# Patient Record
Sex: Female | Born: 1945 | Race: White | Hispanic: No | Smoking: Former smoker
Health system: Southern US, Community
[De-identification: ages and names within clinical notes are randomized; demographics above are authoritative.]

## PROBLEM LIST (undated history)

## (undated) DIAGNOSIS — C801 Malignant (primary) neoplasm, unspecified: Secondary | ICD-10-CM

## (undated) DIAGNOSIS — T7840XA Allergy, unspecified, initial encounter: Secondary | ICD-10-CM

## (undated) DIAGNOSIS — E079 Disorder of thyroid, unspecified: Secondary | ICD-10-CM

## (undated) DIAGNOSIS — J45909 Unspecified asthma, uncomplicated: Secondary | ICD-10-CM

## (undated) DIAGNOSIS — D649 Anemia, unspecified: Secondary | ICD-10-CM

## (undated) DIAGNOSIS — F419 Anxiety disorder, unspecified: Secondary | ICD-10-CM

## (undated) HISTORY — PX: ABDOMINAL HYSTERECTOMY: SHX81

## (undated) HISTORY — DX: Malignant (primary) neoplasm, unspecified: C80.1

## (undated) HISTORY — DX: Anxiety disorder, unspecified: F41.9

## (undated) HISTORY — DX: Anemia, unspecified: D64.9

## (undated) HISTORY — DX: Allergy, unspecified, initial encounter: T78.40XA

## (undated) HISTORY — DX: Unspecified asthma, uncomplicated: J45.909

## (undated) HISTORY — DX: Disorder of thyroid, unspecified: E07.9

## (undated) HISTORY — PX: BREAST SURGERY: SHX581

---

## 2001-12-17 ENCOUNTER — Emergency Department (HOSPITAL_COMMUNITY): Admission: EM | Admit: 2001-12-17 | Discharge: 2001-12-17 | Payer: Self-pay | Admitting: Emergency Medicine

## 2003-02-27 ENCOUNTER — Encounter: Payer: Self-pay | Admitting: Internal Medicine

## 2003-02-27 ENCOUNTER — Encounter: Admission: RE | Admit: 2003-02-27 | Discharge: 2003-02-27 | Payer: Self-pay | Admitting: Internal Medicine

## 2004-05-27 ENCOUNTER — Ambulatory Visit (HOSPITAL_COMMUNITY): Admission: RE | Admit: 2004-05-27 | Discharge: 2004-05-27 | Payer: Self-pay | Admitting: *Deleted

## 2004-09-09 ENCOUNTER — Ambulatory Visit: Admission: RE | Admit: 2004-09-09 | Discharge: 2004-09-09 | Payer: Self-pay | Admitting: Emergency Medicine

## 2007-02-01 ENCOUNTER — Encounter: Admission: RE | Admit: 2007-02-01 | Discharge: 2007-02-01 | Payer: Self-pay | Admitting: General Surgery

## 2009-10-08 LAB — HM MAMMOGRAPHY: HM Mammogram: NORMAL

## 2011-01-21 LAB — TSH: TSH: 0.44 u[IU]/mL (ref <=5.90)

## 2012-01-06 ENCOUNTER — Other Ambulatory Visit: Payer: Self-pay | Admitting: Family Medicine

## 2012-01-06 NOTE — Telephone Encounter (Signed)
Pt must have OV for CPE prior to running out of meds.

## 2012-01-31 ENCOUNTER — Encounter: Payer: Self-pay | Admitting: Family Medicine

## 2012-01-31 DIAGNOSIS — F419 Anxiety disorder, unspecified: Secondary | ICD-10-CM | POA: Insufficient documentation

## 2012-01-31 DIAGNOSIS — E039 Hypothyroidism, unspecified: Secondary | ICD-10-CM | POA: Insufficient documentation

## 2012-02-02 ENCOUNTER — Encounter: Payer: Self-pay | Admitting: Family Medicine

## 2012-02-02 ENCOUNTER — Ambulatory Visit (INDEPENDENT_AMBULATORY_CARE_PROVIDER_SITE_OTHER): Payer: Medicare Other | Admitting: Family Medicine

## 2012-02-02 DIAGNOSIS — Z0000 Encounter for general adult medical examination without abnormal findings: Secondary | ICD-10-CM

## 2012-02-02 DIAGNOSIS — F419 Anxiety disorder, unspecified: Secondary | ICD-10-CM

## 2012-02-02 DIAGNOSIS — Z23 Encounter for immunization: Secondary | ICD-10-CM

## 2012-02-02 DIAGNOSIS — R5383 Other fatigue: Secondary | ICD-10-CM

## 2012-02-02 DIAGNOSIS — E039 Hypothyroidism, unspecified: Secondary | ICD-10-CM

## 2012-02-02 DIAGNOSIS — Z780 Asymptomatic menopausal state: Secondary | ICD-10-CM

## 2012-02-02 DIAGNOSIS — F411 Generalized anxiety disorder: Secondary | ICD-10-CM

## 2012-02-02 NOTE — Progress Notes (Signed)
  Subjective:    Patient ID: Cathy Melendez, female    DOB: 07-27-1946, 66 y.o.   MRN: [id number]  HPI Patient presents for CPE  1) Hypothyroid- states increased fatigue may be secondary to using levothyroxine rather than     synthroid. Requests refill of synthroid and denote "brand name medically necessary". Patient      interested In seeing Dr. Jeraldine Loots.  2) Weight concerns- since 2009 weight gain of 5 pounds  3) Postmenopausal- Patient has reduced her dose by 50%.  Aware of increased risk breast cancer or clotting disorders.      Last pap 2007; s/p TAH  4) Anxiety- takes Zoloft 25 mg 1/2 tablet QOD.  States on this dosage anxiety is controlled   5) (R) pelvic pain; TAH in the past; Pap 2007 normal.  Review of Systems     Objective:   Physical Exam  Constitutional: She appears well-developed and well-nourished.  HENT:  Head: Normocephalic and atraumatic.  Eyes: EOM are normal. Pupils are equal, round, and reactive to light.  Neck: Neck supple. No thyromegaly present.  Cardiovascular: Normal rate, regular rhythm and normal heart sounds.   Pulmonary/Chest: Effort normal and breath sounds normal.  Abdominal: Soft. Bowel sounds are normal. There is no hepatosplenomegaly. There is no tenderness.  Musculoskeletal: Normal range of motion.  Lymphadenopathy:    She has no cervical adenopathy.  Neurological: She is alert.  Skin: Skin is warm.  Psychiatric: She has a normal mood and affect.          Assessment & Plan:   1. Routine general medical examination at a health care facility  Tdap vaccine greater than or equal to 7yo IM  2. Hypothyroid  CBC with Differential, Lipid panel, TSH  3. Anxiety    4. Postmenopausal    5. Fatigue  Comprehensive metabolic panel   Patient plans to follow up with Dr. Jeraldine Loots at [facility] regarding her thyroid. She is also aware of the long term risks of ERT in include increase risk of breast cancer and thromboembolic events.

## 2012-02-03 LAB — CBC WITH DIFFERENTIAL/PLATELET
Basophils Absolute: 0.1 10*3/uL (ref 0.0–0.1)
Basophils Relative: 1 % (ref 0–1)
Hemoglobin: 14.1 g/dL (ref 12.0–15.0)
MCH: 31.8 pg (ref 26.0–34.0)
MCHC: 33.3 g/dL (ref 30.0–36.0)
MCV: 95.5 fL (ref 78.0–100.0)
Monocytes Absolute: 0.7 10*3/uL (ref 0.1–1.0)
Monocytes Relative: 12 % (ref 3–12)
Neutro Abs: 1.6 10*3/uL — ABNORMAL LOW (ref 1.7–7.7)
RBC: 4.44 MIL/uL (ref 3.87–5.11)
RDW: 12.7 % (ref 11.5–15.5)

## 2012-02-03 LAB — COMPREHENSIVE METABOLIC PANEL
AST: 19 U/L (ref 0–37)
Alkaline Phosphatase: 45 U/L (ref 39–117)
BUN: 16 mg/dL (ref 6–23)
Calcium: 9.1 mg/dL (ref 8.4–10.5)
Glucose, Bld: 82 mg/dL (ref 70–99)
Potassium: 4 mEq/L (ref 3.5–5.3)
Total Bilirubin: 1 mg/dL (ref 0.3–1.2)

## 2012-02-03 LAB — LIPID PANEL
LDL Cholesterol: 124 mg/dL — ABNORMAL HIGH (ref 0–99)
VLDL: 24 mg/dL (ref 0–40)

## 2012-02-04 DIAGNOSIS — Z780 Asymptomatic menopausal state: Secondary | ICD-10-CM | POA: Insufficient documentation

## 2012-02-08 ENCOUNTER — Encounter: Payer: Self-pay | Admitting: *Deleted

## 2012-02-08 ENCOUNTER — Encounter: Payer: Self-pay | Admitting: Family Medicine

## 2012-02-20 ENCOUNTER — Other Ambulatory Visit: Payer: Self-pay

## 2012-02-20 MED ORDER — SERTRALINE HCL 25 MG PO TABS: 25.0000 mg | ORAL_TABLET | ORAL | Status: DC

## 2012-03-01 ENCOUNTER — Other Ambulatory Visit: Payer: Self-pay | Admitting: Physician Assistant

## 2012-03-17 ENCOUNTER — Other Ambulatory Visit: Payer: Self-pay | Admitting: Internal Medicine

## 2012-04-20 ENCOUNTER — Ambulatory Visit (INDEPENDENT_AMBULATORY_CARE_PROVIDER_SITE_OTHER): Payer: Medicare Other | Admitting: Family Medicine

## 2012-04-20 DIAGNOSIS — H6980 Other specified disorders of Eustachian tube, unspecified ear: Secondary | ICD-10-CM

## 2012-04-20 DIAGNOSIS — J309 Allergic rhinitis, unspecified: Secondary | ICD-10-CM

## 2012-04-20 MED ORDER — FLUTICASONE PROPIONATE 50 MCG/ACT NA SUSP: 2.0000 | Freq: Every day | NASAL | Status: DC

## 2012-04-20 MED ORDER — PREDNISONE 10 MG PO TABS: ORAL_TABLET | ORAL | Status: DC

## 2012-04-20 NOTE — Progress Notes (Signed)
  Subjective:    Patient ID: Cathy Melendez, female    DOB: Oct 09, 1946, 66 y.o.   MRN: [id number]  HPI  Complains of (R) sided ST; nasal congestion and PND since 12/12 (R) sided neck tenderness  CT sinuses in the past demonstrated deviated septum  Allegra and advil helps symptoms  Review of Systems  Constitutional: Positive for fatigue. Negative for fever and chills.  HENT: Positive for congestion (primarily (R) nares), rhinorrhea and postnasal drip.   denies ear symptoms  SH/ marital stressors    Objective:   Physical Exam  Constitutional: She appears well-developed.  HENT:       Septum deviated to (L)  boggy nares  clear PND  Neck: Neck supple.  Cardiovascular: Normal rate, regular rhythm and normal heart sounds.   Pulmonary/Chest: Effort normal and breath sounds normal.  Lymphadenopathy:    Cervical adenopathy: shoddy anterior nodes.  Neurological: She is alert.  Skin: Skin is warm.          Assessment & Plan:  Allergic rhinitis ETD  Flonase NS UAD Prednisone 10 mg X 7d Continue Allegra

## 2012-05-10 ENCOUNTER — Telehealth: Payer: Self-pay

## 2012-05-10 DIAGNOSIS — H6980 Other specified disorders of Eustachian tube, unspecified ear: Secondary | ICD-10-CM

## 2012-05-10 DIAGNOSIS — J309 Allergic rhinitis, unspecified: Secondary | ICD-10-CM

## 2012-05-10 NOTE — Telephone Encounter (Signed)
Pt is requesting antibiotics or a referral to ENT.  She is going out of town and would like something before she goes. Okay to leave MOM.  Please call [phone]

## 2012-05-11 NOTE — Telephone Encounter (Signed)
LMOM to CB to give [place] some more details about her current Sxs.

## 2012-05-11 NOTE — Telephone Encounter (Signed)
Can we get some more information? If she is not better she probably needs to RTC. We are happy to refer to ENT but she will likely not get in before she goes out of town.

## 2012-05-12 NOTE — Telephone Encounter (Signed)
LMOM to CB. We have started referral to ENT

## 2012-05-14 NOTE — Telephone Encounter (Signed)
LMOM for pt to CB if she is still not feeling well. Sent unable to reach letter w/info that referral is in process.

## 2012-08-04 ENCOUNTER — Encounter: Payer: Self-pay | Admitting: Physician Assistant

## 2012-08-04 DIAGNOSIS — H6980 Other specified disorders of Eustachian tube, unspecified ear: Secondary | ICD-10-CM

## 2012-08-04 DIAGNOSIS — H6990 Unspecified Eustachian tube disorder, unspecified ear: Secondary | ICD-10-CM | POA: Insufficient documentation

## 2012-08-04 DIAGNOSIS — J342 Deviated nasal septum: Secondary | ICD-10-CM

## 2012-09-01 ENCOUNTER — Encounter: Payer: Self-pay | Admitting: Family Medicine

## 2012-09-01 DIAGNOSIS — J310 Chronic rhinitis: Secondary | ICD-10-CM | POA: Insufficient documentation

## 2012-09-03 ENCOUNTER — Other Ambulatory Visit: Payer: Self-pay | Admitting: Physician Assistant

## 2012-09-17 ENCOUNTER — Other Ambulatory Visit: Payer: Self-pay | Admitting: Physician Assistant

## 2012-10-28 ENCOUNTER — Other Ambulatory Visit: Payer: Self-pay | Admitting: Physician Assistant

## 2013-01-19 ENCOUNTER — Encounter: Payer: Self-pay | Admitting: Family Medicine

## 2013-01-19 ENCOUNTER — Ambulatory Visit (INDEPENDENT_AMBULATORY_CARE_PROVIDER_SITE_OTHER): Payer: Medicare PPO | Admitting: Family Medicine

## 2013-01-19 VITALS — BP 114/65 | HR 68 | Temp 97.1°F | Resp 16 | Ht 66.5 in | Wt 129.0 lb

## 2013-01-19 DIAGNOSIS — D72820 Lymphocytosis (symptomatic): Secondary | ICD-10-CM

## 2013-01-19 DIAGNOSIS — Z86010 Personal history of colonic polyps: Secondary | ICD-10-CM

## 2013-01-19 DIAGNOSIS — Z760 Encounter for issue of repeat prescription: Secondary | ICD-10-CM

## 2013-01-19 DIAGNOSIS — E039 Hypothyroidism, unspecified: Secondary | ICD-10-CM

## 2013-01-19 DIAGNOSIS — C4490 Unspecified malignant neoplasm of skin, unspecified: Secondary | ICD-10-CM

## 2013-01-19 LAB — CBC WITH DIFFERENTIAL/PLATELET
Basophils Relative: 2 % — ABNORMAL HIGH (ref 0–1)
Eosinophils Absolute: 0.1 10*3/uL (ref 0.0–0.7)
Eosinophils Relative: 2 % (ref 0–5)
HCT: 39.9 % (ref 36.0–46.0)
Hemoglobin: 13.6 g/dL (ref 12.0–15.0)
Lymphs Abs: 2.3 10*3/uL (ref 0.7–4.0)
MCHC: 34.1 g/dL (ref 30.0–36.0)
Monocytes Absolute: 0.6 10*3/uL (ref 0.1–1.0)
Neutrophils Relative %: 29 % — ABNORMAL LOW (ref 43–77)
Smear Review: 0

## 2013-01-19 MED ORDER — LEVOTHYROXINE SODIUM 88 MCG PO TABS: ORAL_TABLET | ORAL | Status: DC

## 2013-01-19 MED ORDER — ESTROGENS CONJUGATED 0.3 MG PO TABS: ORAL_TABLET | ORAL | Status: DC

## 2013-01-19 NOTE — Patient Instructions (Addendum)
Shingles Vaccine What You Need to Know WHAT IS SHINGLES?  Shingles is a painful skin rash, often with blisters. It is also called Herpes Zoster or just Zoster.  A shingles rash usually appears on one side of the face or body and lasts from 2 to 4 weeks. Its main symptom is pain, which can be quite severe. Other symptoms of shingles can include fever, headache, chills, and upset stomach. Very rarely, a shingles infection can lead to pneumonia, hearing problems, blindness, brain inflammation (encephalitis), or death.  For about 1 person in 5, severe pain can continue even after the rash clears up. This is called post-herpetic neuralgia.  Shingles is caused by the Varicella Zoster virus. This is the same virus that causes chickenpox. Only someone who has had a case of chickenpox or rarely, has gotten chickenpox vaccine, can get shingles. The virus stays in your body. It can reappear many years later to cause a case of shingles.  You cannot catch shingles from another person with shingles. However, a person who has never had chickenpox (or chickenpox vaccine) could get chickenpox from someone with shingles. This is not very common.  Shingles is far more common in people 50 and older than in younger people. It is also more common in people whose immune systems are weakened because of a disease such as cancer or drugs such as steroids or chemotherapy.  At least 1 million people get shingles per year in the [place]. SHINGLES VACCINE  A vaccine for shingles was licensed in 2006. In clinical trials, the vaccine reduced the risk of shingles by 50%. It can also reduce the pain in people who still get shingles after being vaccinated.  A single dose of shingles vaccine is recommended for adults 60 years of age and older. SOME PEOPLE SHOULD NOT GET SHINGLES VACCINE OR SHOULD WAIT A person should not get shingles vaccine if he or she:  Has ever had a life-threatening allergic reaction to gelatin, the  antibiotic neomycin, or any other component of shingles vaccine. Tell your caregiver if you have any severe allergies.  Has a weakened immune system because of current:  AIDS or another disease that affects the immune system.  Treatment with drugs that affect the immune system, such as prolonged use of high-dose steroids.  Cancer treatment, such as radiation or chemotherapy.  Cancer affecting the bone marrow or lymphatic system, such as leukemia or lymphoma.  Is pregnant, or might be pregnant. Women should not become pregnant until at least 4 weeks after getting shingles vaccine. Someone with a minor illness, such as a cold, may be vaccinated. Anyone with a moderate or severe acute illness should usually wait until he or she recovers before getting the vaccine. This includes anyone with a temperature of 101.3 F (38 C) or higher. WHAT ARE THE RISKS FROM SHINGLES VACCINE?  A vaccine, like any medicine, could possibly cause serious problems, such as severe allergic reactions. However, the risk of a vaccine causing serious harm, or death, is extremely small.  No serious problems have been identified with shingles vaccine. Mild Problems  Redness, soreness, swelling, or itching at the site of the injection (about 1 person in 3).  Headache (about 1 person in 70). Like all vaccines, shingles vaccine is being closely monitored for unusual or severe problems. WHAT IF THERE IS A MODERATE OR SEVERE REACTION? What should I look for? Any unusual condition, such as a severe allergic reaction or a high fever. If a severe allergic reaction   occurred, it would be within a few minutes to an hour after the shot. Signs of a serious allergic reaction can include difficulty breathing, weakness, hoarseness or wheezing, a fast heartbeat, hives, dizziness, paleness, or swelling of the throat. What should I do?  Call your caregiver, or get the person to a caregiver right away.  Tell the caregiver what  happened, the date and time it happened, and when the vaccination was given.  Ask the caregiver to report the reaction by filing a Vaccine Adverse Event Reporting System (VAERS) form. Or, you can file this report through the VAERS web site at www.vaers.hhs.gov or by calling 1-800-822-7967. VAERS does not provide medical advice. HOW CAN I LEARN MORE?  Ask your caregiver. He or she can give you the vaccine package insert or suggest other sources of information.  Contact the Centers for Disease Control and Prevention (CDC):  Call 1-800-232-4636 (1-800-CDC-INFO).  Visit the CDC website at www.cdc.gov/vaccines CDC Shingles Vaccine VIS (09/12/08) Document Released: 09/21/2006 Document Revised: 02/16/2012 Document Reviewed: 09/12/2008 ExitCare Patient Information 2013 [employer].  

## 2013-01-19 NOTE — Progress Notes (Signed)
S:  This 67 y.o. Cauc female has a long hx of hypothyroidism, medication dating back to 19. Work -up has included evaluation by Dr. Nonie Hoyer at [facility] (thyroid ultrasound and FNA). She reports multinodular goiter diagnosed in teen years.  She has felt stable on medication (thinks Synthroid was more effective than Levothyroxine) but notes some weight gain, dry hair, constipation and mild fatigue.   Pt is post-menopausal and continues to take reduced dose of Premarin 0.3 mg 1/2 tablet daily. Mammograms have been negative. She notes no changes in breasts. Last PAP (normal) - Dec 2013 (per pt report). She is s/pTAH.  Pt has hx of skin cancer; she has periodic surveillance at  [facility] in [place], [place]. She has some concerns about Vit D deficiency; she avoids sun exposure and does not take a separate Vit D supplement.  ROS: As per HPI; negative for significant weight or appetite change, CP or tightness, palpitations, edema, SOB or DOE, abd pain, BRBPR or melena, pelvic pain, myalgias/ arthralgias,HA, dizziness, weakness, syncope or sleep disturbance.   O:  Filed Vitals:   01/19/13 1004  BP: 114/65  Pulse: 68  Temp: 97.1 F (36.2 C)  Resp: 16   GEN: In NAD; WN,WD. HENT: [place]/AT; EOMI w/ clear conj/ sclerae; nose w/o deformity or septal deviation. Oroph clear and moist. NECK: Supple w/o LAN or palpable thyroid nodules. COR: RRR. No edema. LUNGS: Normal resp rate and effort. SKIN: W&D; no rashes, erythema or pallor. NEURO: A&O x 3; CNs intact. DTRs 1-2+/=. Motor function grossly normal. Gait -normal. Nonfocal.  A/P:  Unspecified hypothyroidism - stable on current dose of Levothyroxine 88 mcg/ day.  Plan: TSH, T3, Free, Vitamin D, 25-hydroxy  Lymphocytosis - previous CBC has normal WBC w/ 29% Neutrophils and 55% Lymphocytes   Plan: CBC with Differential  Skin cancer- continue skin surveillance with specialist at [facility] in [place], [place].  Personal history of colonic polyps - Plan:  Ambulatory referral to Gastroenterology

## 2013-01-20 LAB — VITAMIN D 25 HYDROXY (VIT D DEFICIENCY, FRACTURES): Vit D, 25-Hydroxy: 50 ng/mL (ref 30–89)

## 2013-01-22 NOTE — Progress Notes (Signed)
Quick Note:  Please notify pt that results are normal.   Provide pt with copy of labs. ______ 

## 2013-01-23 ENCOUNTER — Encounter: Payer: Self-pay | Admitting: *Deleted

## 2013-01-26 ENCOUNTER — Encounter: Payer: Self-pay | Admitting: Internal Medicine

## 2013-02-23 ENCOUNTER — Encounter: Payer: Self-pay | Admitting: Family Medicine

## 2013-05-23 ENCOUNTER — Other Ambulatory Visit: Payer: Self-pay | Admitting: Physician Assistant

## 2013-05-28 ENCOUNTER — Other Ambulatory Visit: Payer: Self-pay | Admitting: Physician Assistant

## 2013-05-28 NOTE — Telephone Encounter (Signed)
Patient calling to get a refill on generic zoloft.  [facility] [employer] [facility] [phone]

## 2013-05-29 NOTE — Telephone Encounter (Signed)
Please call this patient and clarify that she takes 25 mg QOD, and gets 25 tabs each fill.

## 2013-05-30 NOTE — Telephone Encounter (Signed)
LMOM to Cb. 

## 2013-05-30 NOTE — Telephone Encounter (Signed)
Pt states she takes 1/2 tab daily so really only needs about #15/mos and will plan to come in before Aug for recheck.

## 2013-07-01 ENCOUNTER — Telehealth: Payer: Self-pay

## 2013-08-29 ENCOUNTER — Other Ambulatory Visit: Payer: Self-pay | Admitting: Family Medicine

## 2013-09-24 ENCOUNTER — Other Ambulatory Visit: Payer: Self-pay | Admitting: Family Medicine

## 2013-09-30 ENCOUNTER — Other Ambulatory Visit: Payer: Self-pay | Admitting: Physician Assistant

## 2013-10-11 ENCOUNTER — Encounter: Payer: Self-pay | Admitting: Emergency Medicine

## 2013-10-11 ENCOUNTER — Ambulatory Visit (INDEPENDENT_AMBULATORY_CARE_PROVIDER_SITE_OTHER): Payer: Medicare PPO | Admitting: Emergency Medicine

## 2013-10-11 ENCOUNTER — Ambulatory Visit: Payer: Medicare PPO

## 2013-10-11 VITALS — BP 110/70 | HR 65 | Temp 97.5°F | Resp 16 | Ht 66.0 in | Wt 125.0 lb

## 2013-10-11 DIAGNOSIS — Z0000 Encounter for general adult medical examination without abnormal findings: Secondary | ICD-10-CM

## 2013-10-11 DIAGNOSIS — R9431 Abnormal electrocardiogram [ECG] [EKG]: Secondary | ICD-10-CM

## 2013-10-11 DIAGNOSIS — Z139 Encounter for screening, unspecified: Secondary | ICD-10-CM

## 2013-10-11 DIAGNOSIS — M542 Cervicalgia: Secondary | ICD-10-CM

## 2013-10-11 DIAGNOSIS — E039 Hypothyroidism, unspecified: Secondary | ICD-10-CM

## 2013-10-11 LAB — COMPREHENSIVE METABOLIC PANEL
ALT: 10 U/L (ref 0–35)
AST: 19 U/L (ref 0–37)
Albumin: 4.2 g/dL (ref 3.5–5.2)
BUN: 14 mg/dL (ref 6–23)
CO2: 27 mEq/L (ref 19–32)
Calcium: 9.4 mg/dL (ref 8.4–10.5)
Glucose, Bld: 77 mg/dL (ref 70–99)
Sodium: 140 mEq/L (ref 135–145)
Total Bilirubin: 0.6 mg/dL (ref 0.3–1.2)

## 2013-10-11 LAB — POCT URINALYSIS DIPSTICK
Bilirubin, UA: NEGATIVE
Blood, UA: NEGATIVE
Glucose, UA: NEGATIVE
Ketones, UA: NEGATIVE
Leukocytes, UA: NEGATIVE
Nitrite, UA: NEGATIVE
Protein, UA: NEGATIVE
Spec Grav, UA: 1.015
Urobilinogen, UA: 0.2
pH, UA: 7

## 2013-10-11 LAB — CBC WITH DIFFERENTIAL/PLATELET
Basophils Absolute: 0 10*3/uL (ref 0.0–0.1)
Eosinophils Absolute: 0.2 10*3/uL (ref 0.0–0.7)
Eosinophils Relative: 3 % (ref 0–5)
Hemoglobin: 14 g/dL (ref 12.0–15.0)
MCH: 32.4 pg (ref 26.0–34.0)
MCHC: 34.1 g/dL (ref 30.0–36.0)
Monocytes Relative: 12 % (ref 3–12)
Platelets: 271 10*3/uL (ref 150–400)
RBC: 4.32 MIL/uL (ref 3.87–5.11)

## 2013-10-11 LAB — LIPID PANEL
Cholesterol: 231 mg/dL — ABNORMAL HIGH (ref 0–200)
Triglycerides: 115 mg/dL (ref <150)
VLDL: 23 mg/dL (ref 0–40)

## 2013-10-11 LAB — IFOBT (OCCULT BLOOD): IFOBT: NEGATIVE

## 2013-10-11 MED ORDER — SERTRALINE HCL 25 MG PO TABS: 12.5000 mg | ORAL_TABLET | Freq: Every day | ORAL | Status: DC

## 2013-10-11 MED ORDER — LEVOTHYROXINE SODIUM 88 MCG PO TABS: 88.0000 ug | ORAL_TABLET | Freq: Every day | ORAL | Status: DC

## 2013-10-11 NOTE — Progress Notes (Signed)
  Subjective:    Patient ID: Cathy Melendez, female    DOB: Jul 01, 1946, 67 y.o.   MRN: [id number]  HPI    Review of Systems  Constitutional: Positive for fatigue.  HENT: Positive for congestion, postnasal drip, rhinorrhea, sinus pressure and sneezing.   Eyes: Negative.   Respiratory: Negative.   Cardiovascular: Positive for palpitations.  Gastrointestinal: Positive for constipation.  Endocrine: Negative.   Genitourinary: Positive for pelvic pain.  Musculoskeletal: Positive for neck pain and neck stiffness.  Skin: Negative.   Allergic/Immunologic: Negative.   Neurological: Negative.   Hematological: Negative.   Psychiatric/Behavioral: Negative.        Objective:   Physical Exam H. EENT exam is totally unremarkable. Her neck is supple. Carotids are 2+. Upper extremity motor strength is 2+ reflexes 2+ chest is clear to auscultation and percussion breasts are without masses there appear to be bilateral implants. Cardiac exam reveals regular rate no murmurs abdomen soft liver and spleen not enlarged. Genitourinary exam reveals the vulva does appear normal the vaginal cuff looks normal there are no adnexal masses. Rectal exam confirmed these findings without evidence of mass [facility] reading (PRIMARY) by  Dr. 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 chest x-ray is normal there may be some tenting at the right costal phrenic angle please comment  EKG  there is a prolonged QT Results for orders placed in visit on 10/11/13  POCT URINALYSIS DIPSTICK      Result Value Range   Color, UA yellow     Clarity, UA clear     Glucose, UA neg     Bilirubin, UA neg     Ketones, UA neg     Spec Grav, UA 1.015     Blood, UA neg     pH, UA 7.0     Protein, UA neg     Urobilinogen, UA 0.2     Nitrite, UA neg     Leukocytes, UA Negative    IFOBT (OCCULT BLOOD)      Result Value Range   IFOBT Negative         Assessment & Plan:  Patient has signs and symptoms consistent with cervical spine  disease. I have encouraged her to wean down off the Premarin over the next 3 weeks and see how she does if she starts to have symptoms we can refer her to one of the GYN endocrinologist for their help. We'll change her Synthroid back to trade Synthroid and see if this helps. Referral made to cardiology regarding prolonged QT seen on EKG . She has occasional palpitations and has had these for years and this may be associated with the EKG abnormalities. A bone density will place scheduled. Recheck 3 month. She is going to have her colonoscopy in the near future.

## 2013-10-12 ENCOUNTER — Encounter: Payer: Self-pay | Admitting: *Deleted

## 2013-10-12 LAB — SEDIMENTATION RATE: Sed Rate: 1 mm/hr (ref 0–22)

## 2013-10-12 LAB — PAP IG (IMAGE GUIDED)

## 2014-01-26 ENCOUNTER — Telehealth: Payer: Self-pay | Admitting: Radiology

## 2014-01-28 NOTE — Telephone Encounter (Signed)
Error screen 

## 2014-02-15 ENCOUNTER — Encounter: Payer: Self-pay | Admitting: Emergency Medicine

## 2014-09-26 ENCOUNTER — Telehealth: Payer: Self-pay

## 2014-09-26 NOTE — Telephone Encounter (Signed)
Patient called to return a call. Please advise. States someone left a message but she could not understand. Please return call and leave a detailed message on her phone. [phone]

## 2014-09-26 NOTE — Telephone Encounter (Signed)
No record of reason for call being made.

## 2014-10-20 ENCOUNTER — Telehealth: Payer: Self-pay | Admitting: *Deleted

## 2014-10-20 ENCOUNTER — Other Ambulatory Visit: Payer: Self-pay | Admitting: Emergency Medicine

## 2014-10-20 NOTE — Telephone Encounter (Signed)
Spoke with patient she is going to call for appointment, for additional refills.  30 days called in.

## 2014-10-25 IMAGING — CR DG CERVICAL SPINE 2 OR 3 VIEWS
3 series · 3 of 3 positions shown · non-contrast
Comparison: None.

CLINICAL DATA: Neck stiffness.

EXAM:
CERVICAL SPINE - 2-3 VIEW

[other]
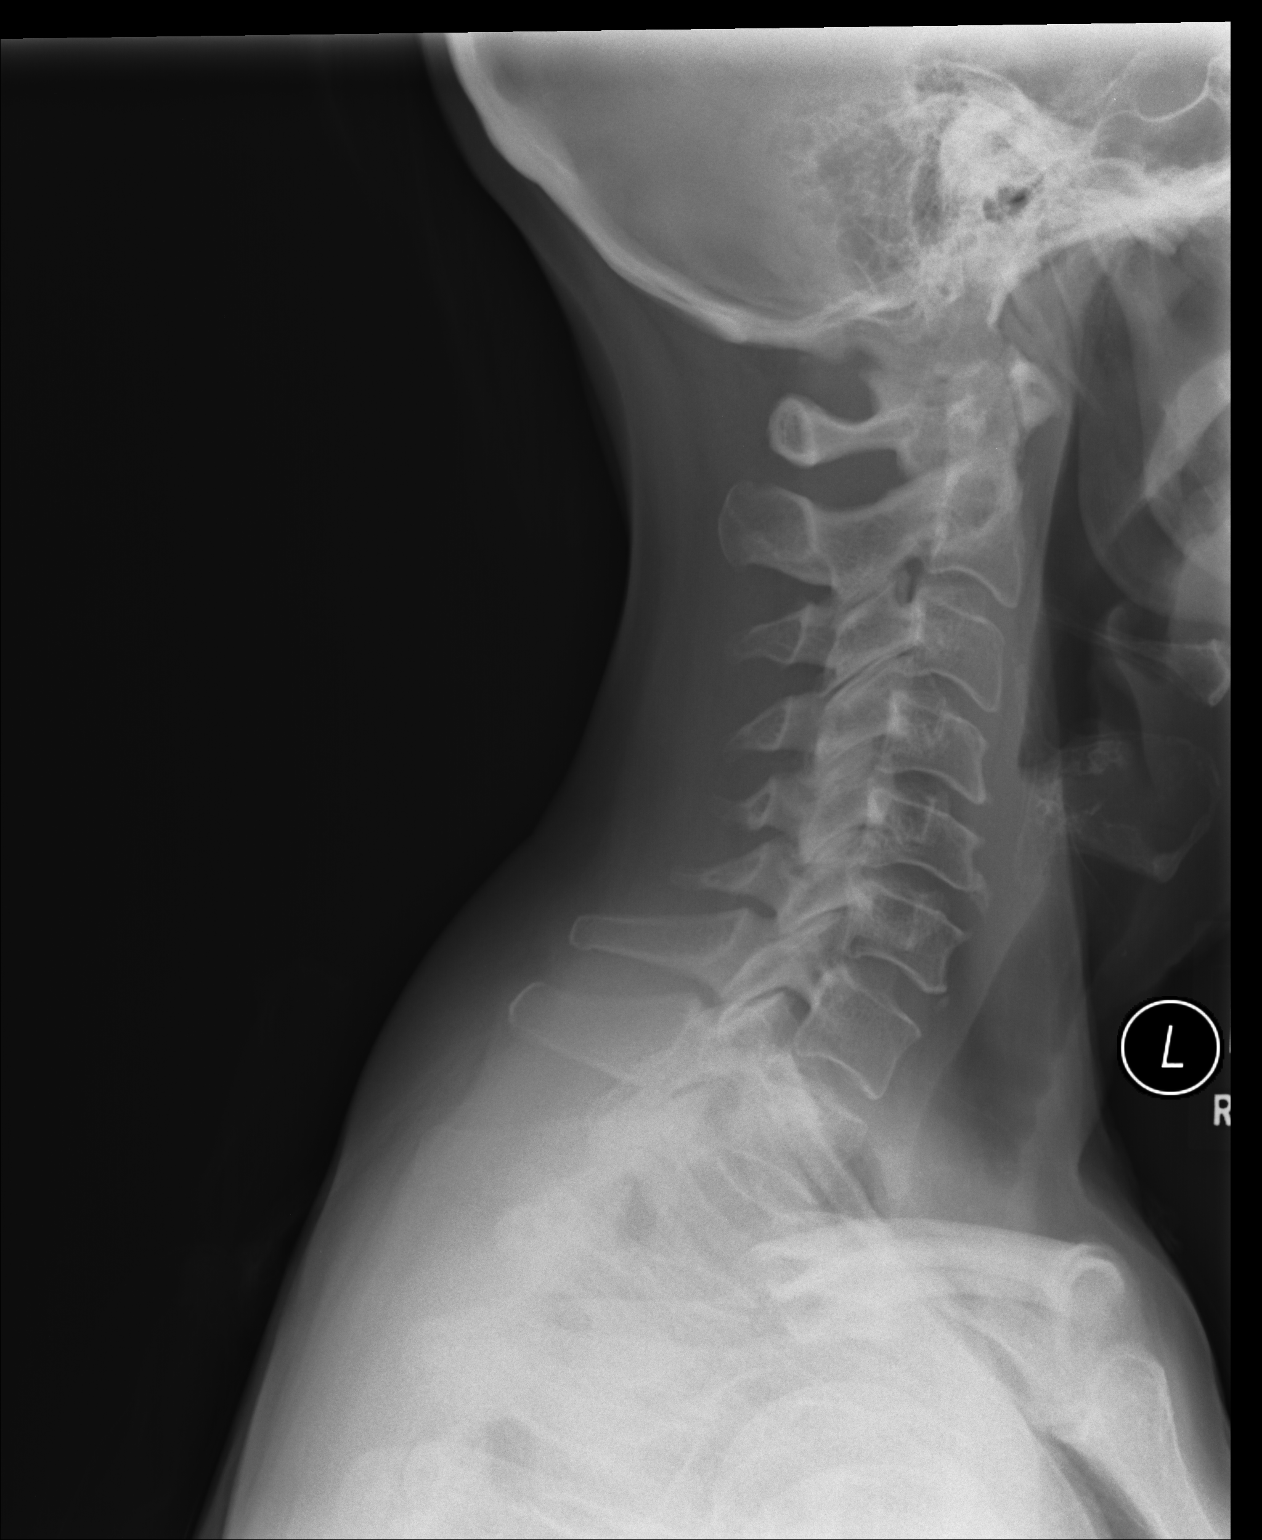

[AP]
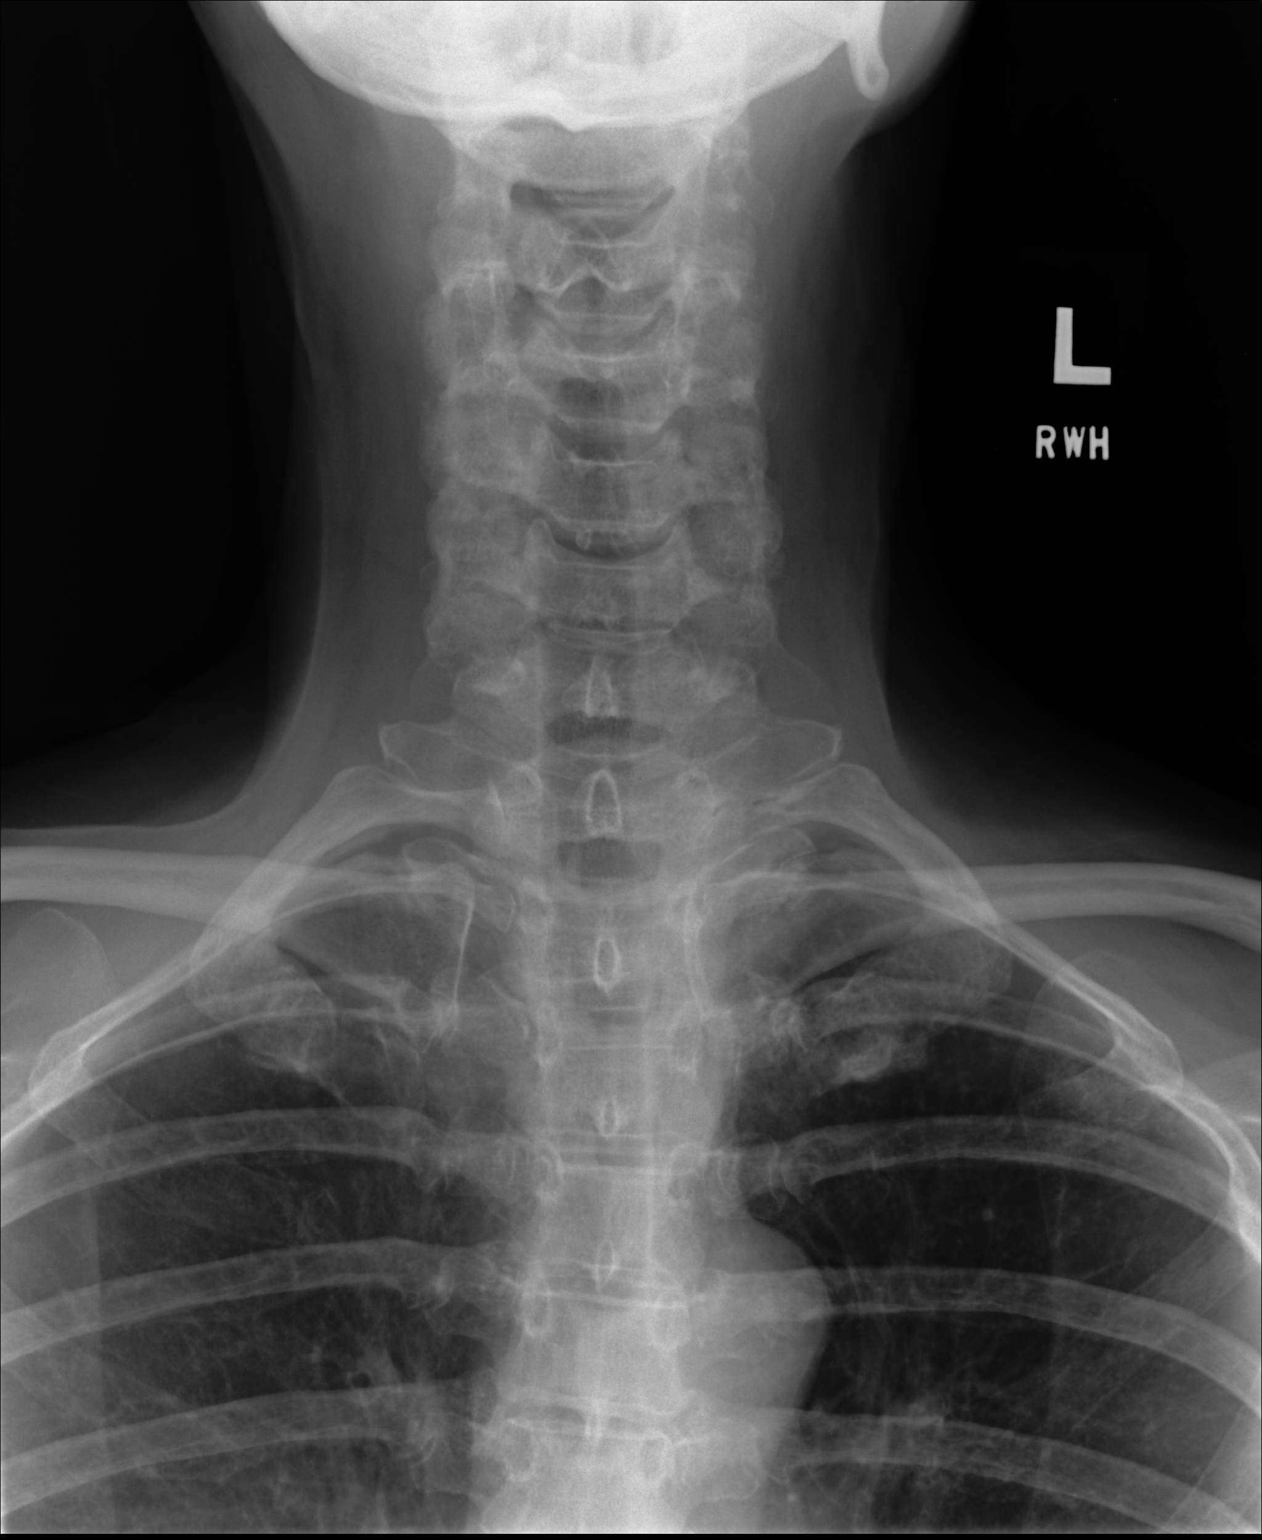

[lateral]
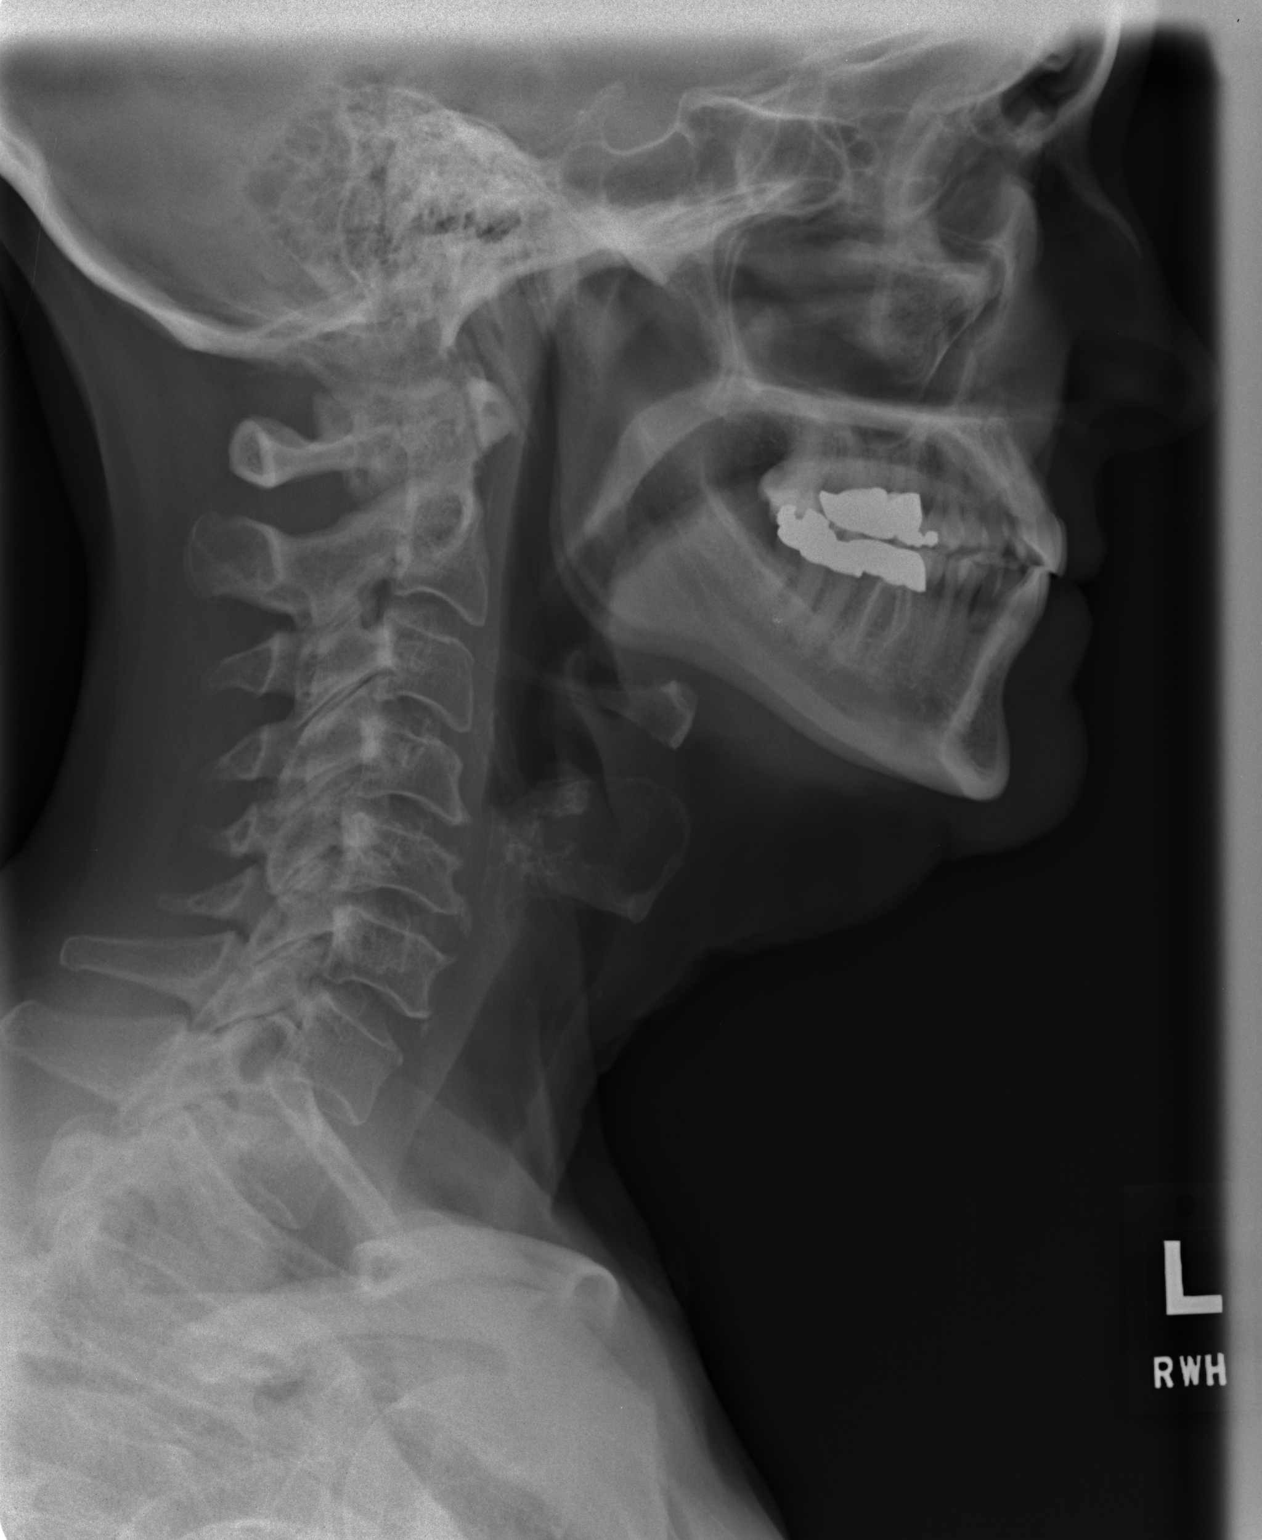

[3 of 3 positions shown; findings below may reference images not displayed]

FINDINGS: Mild degenerative spurring noted at C5-6. Disc spaces are
maintained. Prevertebral soft tissues are normal. Alignment is
normal. No fracture. Mild degenerative facet disease bilaterally
diffusely.
IMPRESSION: Degenerative facet and disc disease as above. No acute findings.

## 2014-10-25 IMAGING — CR DG CHEST 2V
3 series · 3 of 3 positions shown · non-contrast
Comparison: Chest radiograph 12/05/2009.

CLINICAL DATA: Screening evaluation. Former smoker.

EXAM:
CHEST  2 VIEW

[PA (1 of 2)]
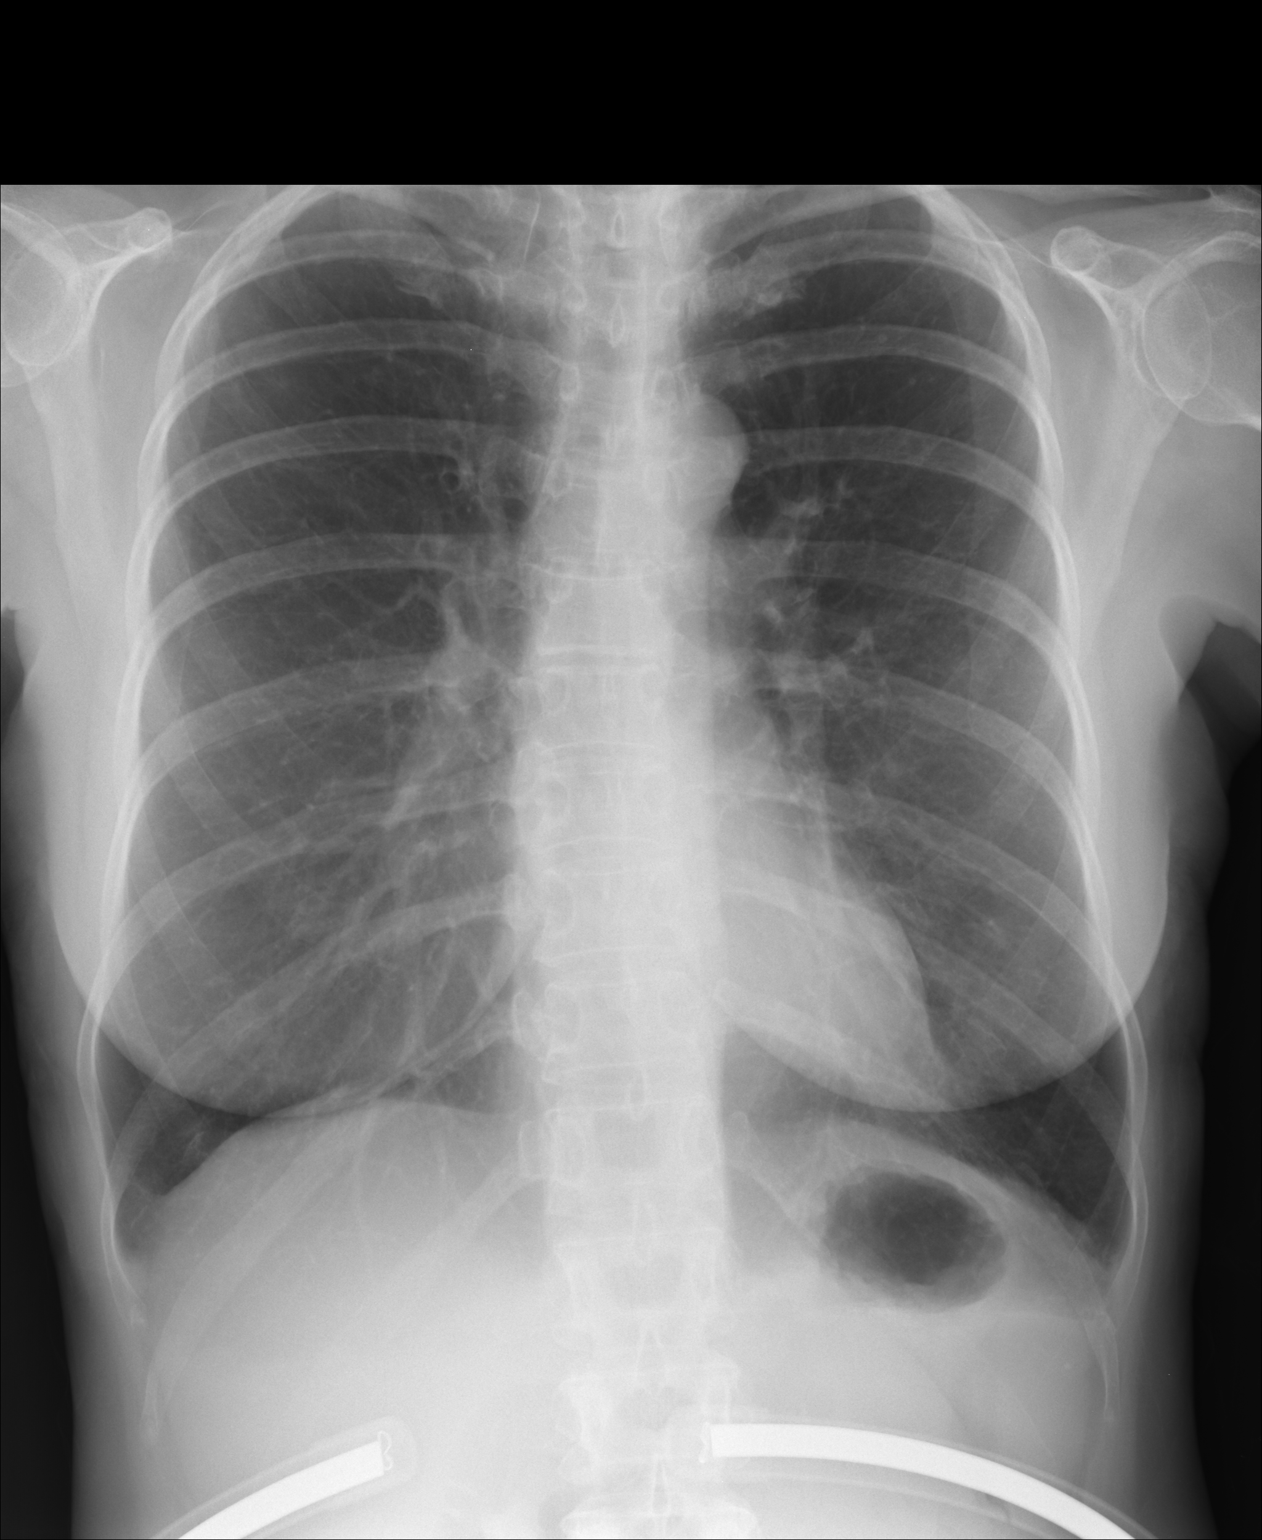

[PA (2 of 2)]
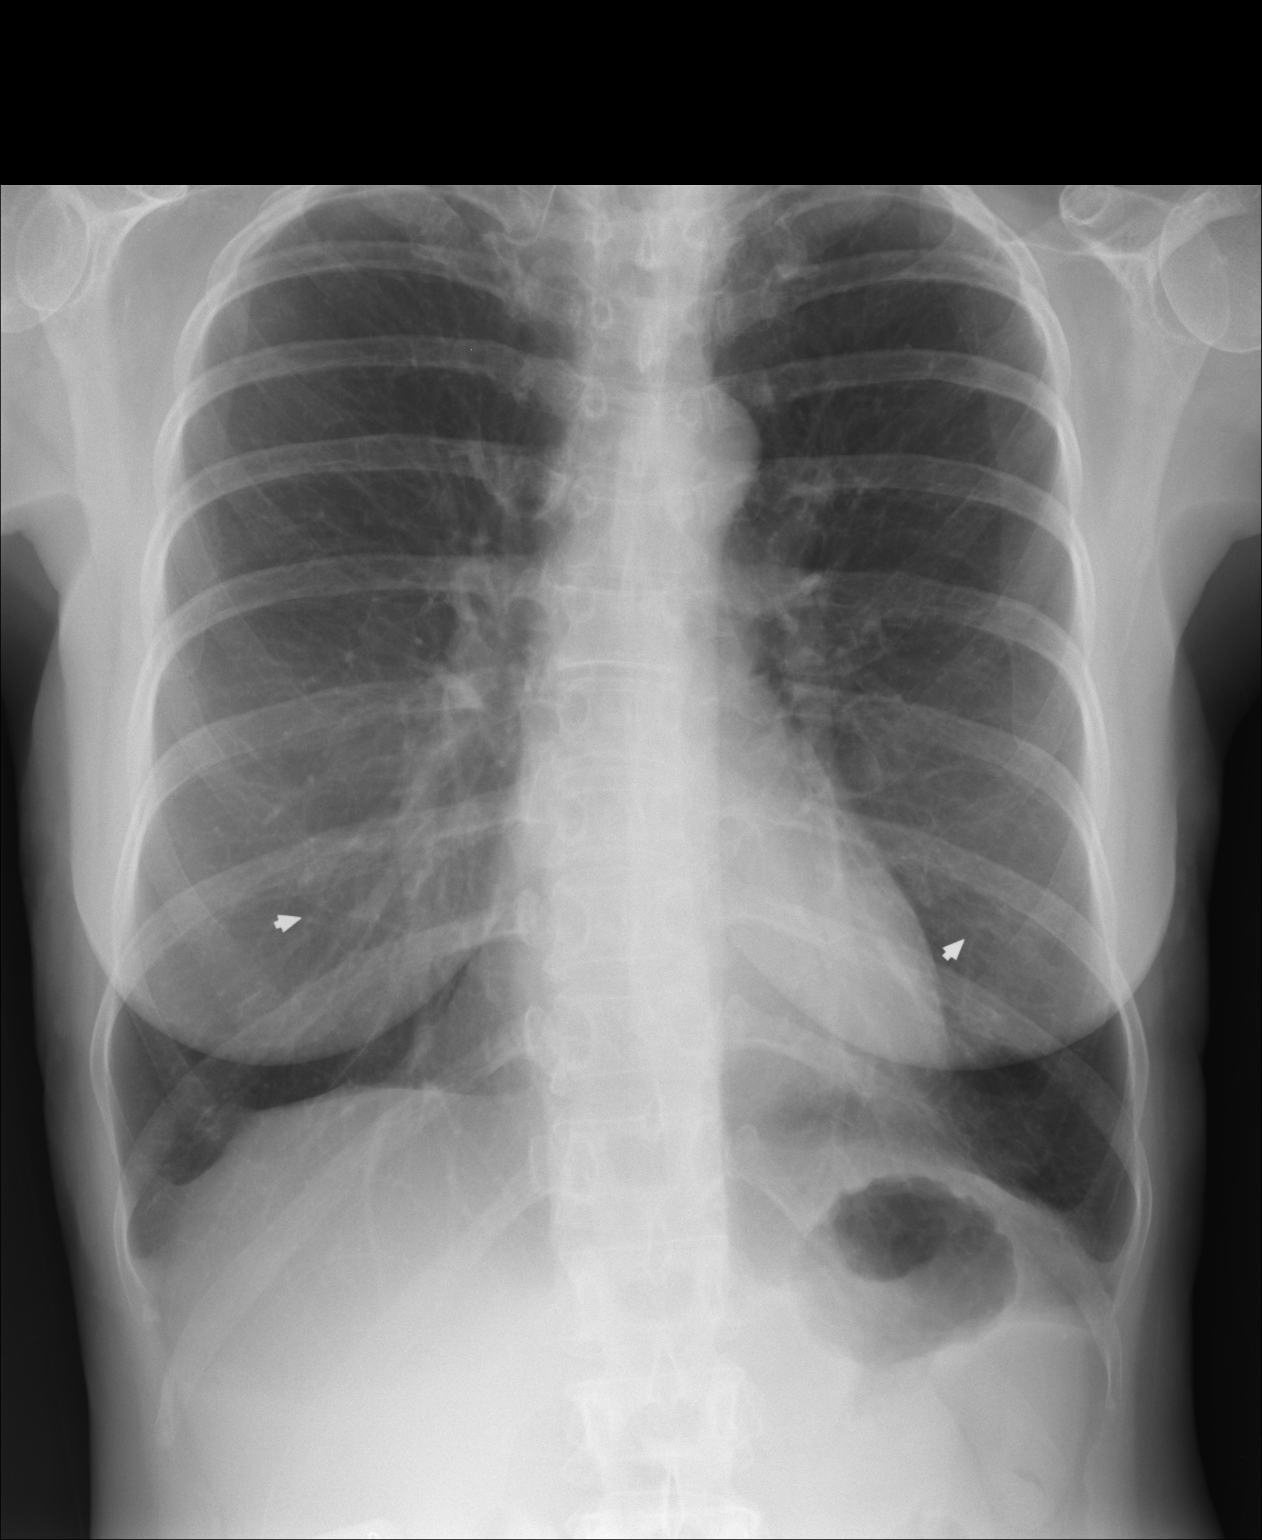

[lateral]
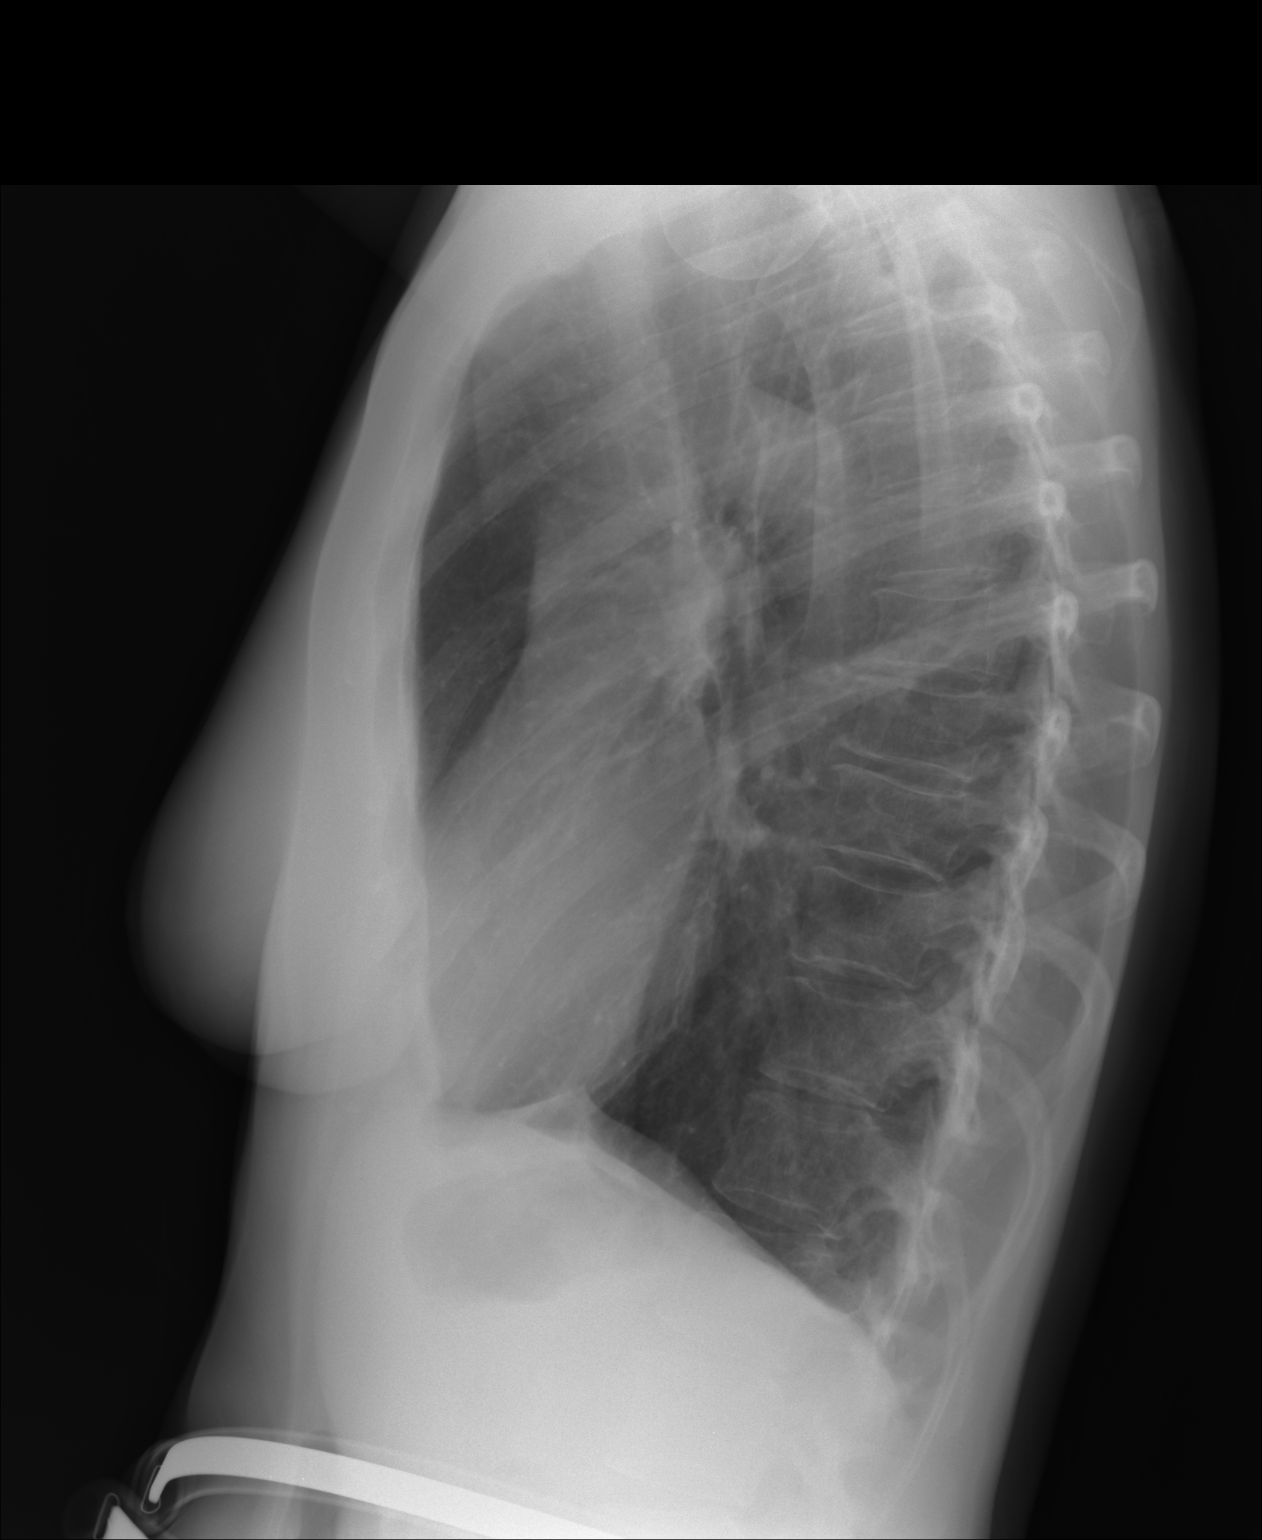

[3 of 3 positions shown; findings below may reference images not displayed]

FINDINGS: Stable cardiac and mediastinal contours. No consolidative pulmonary
opacities. No pleural effusion or pneumothorax. Regional skeleton is
unremarkable.
IMPRESSION: No acute cardiopulmonary process.

## 2014-11-03 ENCOUNTER — Other Ambulatory Visit: Payer: Self-pay | Admitting: Emergency Medicine

## 2014-11-16 ENCOUNTER — Ambulatory Visit: Payer: Medicare PPO | Admitting: Emergency Medicine

## 2014-11-20 ENCOUNTER — Ambulatory Visit (INDEPENDENT_AMBULATORY_CARE_PROVIDER_SITE_OTHER): Payer: Medicare PPO | Admitting: Emergency Medicine

## 2014-11-20 ENCOUNTER — Encounter: Payer: Self-pay | Admitting: Emergency Medicine

## 2014-11-20 VITALS — BP 98/64 | HR 66 | Temp 97.8°F | Resp 16 | Ht 66.25 in | Wt 130.0 lb

## 2014-11-20 DIAGNOSIS — Z23 Encounter for immunization: Secondary | ICD-10-CM

## 2014-11-20 DIAGNOSIS — E039 Hypothyroidism, unspecified: Secondary | ICD-10-CM

## 2014-11-20 DIAGNOSIS — R635 Abnormal weight gain: Secondary | ICD-10-CM

## 2014-11-20 LAB — TSH: TSH: 0.374 u[IU]/mL (ref 0.350–4.500)

## 2014-11-20 LAB — T4, FREE: Free T4: 1.36 ng/dL (ref 0.80–1.80)

## 2014-11-20 MED ORDER — SYNTHROID 88 MCG PO TABS: ORAL_TABLET | ORAL | Status: DC

## 2014-11-20 NOTE — Addendum Note (Signed)
Addended byCandice Camp on: 11/20/2014 11:39 AM   Modules accepted: Orders

## 2014-11-20 NOTE — Progress Notes (Signed)
   Subjective:    Patient ID: Cathy Melendez, female    DOB: [birth year], 68 y.o.   MRN: [id number] This chart was scribed for Arlyss Queen, MD by Marti Sleigh, Medical Scribe. This patient was seen in Room 21 and the patient's care was started at 10:26 AM.  Chief Complaint  Patient presents with  . Medication Refill    synthorid  . Hypothyroidism    HPI HPI Comments: Cathy Melendez is a 68 y.o. female with a hx of hypothyroidism who presents to [facility] needing a medication refill. Pt states she needs a refill of her synthroid medication. Pt states her weight has increased 7-8 pounds over the last several years. Pt states she would like a flu shot. Pt states she is interested in doing the prevnar 13 shot when she comes for her physical in two weeks. Pt states she is interested in increasing her synthroid medication.    Review of Systems  Constitutional: Positive for fatigue.       Objective:   Physical Exam  Constitutional: She is oriented to person, place, and time. She appears well-developed and well-nourished.  HENT:  Head: Normocephalic and atraumatic.  Eyes: Pupils are equal, round, and reactive to light.  Neck: No JVD present.  Cardiovascular: Normal rate and regular rhythm.   Pulmonary/Chest: Effort normal and breath sounds normal. No respiratory distress.  Neurological: She is alert and oriented to person, place, and time.  Skin: Skin is warm and dry.  Psychiatric: She has a normal mood and affect. Her behavior is normal.  Nursing note and vitals reviewed.      Assessment & Plan:   Pt will take synthroid on a daily basis, and will check blood levels today. Will recheck in January when pt has her full physical. TSH and T4 were done today. She was given a flu shot today.I personally performed the services described in this documentation, which was scribed in my presence. The recorded information has been reviewed and is accurate.

## 2014-12-11 ENCOUNTER — Other Ambulatory Visit: Payer: Self-pay | Admitting: Physician Assistant

## 2015-01-02 ENCOUNTER — Encounter: Payer: Medicare PPO | Admitting: Emergency Medicine

## 2015-02-08 ENCOUNTER — Other Ambulatory Visit: Payer: Self-pay | Admitting: Physician Assistant

## 2015-02-15 ENCOUNTER — Other Ambulatory Visit: Payer: Self-pay | Admitting: Emergency Medicine

## 2015-02-15 ENCOUNTER — Ambulatory Visit (INDEPENDENT_AMBULATORY_CARE_PROVIDER_SITE_OTHER): Payer: Medicare PPO | Admitting: Emergency Medicine

## 2015-02-15 ENCOUNTER — Encounter: Payer: Self-pay | Admitting: Emergency Medicine

## 2015-02-15 VITALS — BP 107/65 | HR 79 | Temp 97.7°F | Resp 16 | Ht 66.5 in | Wt 128.0 lb

## 2015-02-15 DIAGNOSIS — E039 Hypothyroidism, unspecified: Secondary | ICD-10-CM

## 2015-02-15 DIAGNOSIS — I493 Ventricular premature depolarization: Secondary | ICD-10-CM

## 2015-02-15 DIAGNOSIS — R9431 Abnormal electrocardiogram [ECG] [EKG]: Secondary | ICD-10-CM

## 2015-02-15 DIAGNOSIS — Z23 Encounter for immunization: Secondary | ICD-10-CM | POA: Diagnosis not present

## 2015-02-15 DIAGNOSIS — R002 Palpitations: Secondary | ICD-10-CM

## 2015-02-15 DIAGNOSIS — Z780 Asymptomatic menopausal state: Secondary | ICD-10-CM

## 2015-02-15 DIAGNOSIS — Z0000 Encounter for general adult medical examination without abnormal findings: Secondary | ICD-10-CM

## 2015-02-15 DIAGNOSIS — R102 Pelvic and perineal pain: Secondary | ICD-10-CM

## 2015-02-15 DIAGNOSIS — R1900 Intra-abdominal and pelvic swelling, mass and lump, unspecified site: Secondary | ICD-10-CM | POA: Diagnosis not present

## 2015-02-15 LAB — COMPLETE METABOLIC PANEL WITH GFR
ALT: 13 U/L (ref 0–35)
AST: 19 U/L (ref 0–37)
Albumin: 3.9 g/dL (ref 3.5–5.2)
Alkaline Phosphatase: 46 U/L (ref 39–117)
BUN: 14 mg/dL (ref 6–23)
CO2: 26 meq/L (ref 19–32)
Calcium: 9.1 mg/dL (ref 8.4–10.5)
Chloride: 103 mEq/L (ref 96–112)
Creat: 0.66 mg/dL (ref 0.50–1.10)
GFR, Est African American: >89 mL/min
GFR, Est Non African American: >89 mL/min
Glucose, Bld: 89 mg/dL (ref 70–99)
POTASSIUM: 4.4 meq/L (ref 3.5–5.3)
SODIUM: 138 meq/L (ref 135–145)
Total Bilirubin: 1.2 mg/dL (ref 0.2–1.2)
Total Protein: 6.6 g/dL (ref 6.0–8.3)

## 2015-02-15 LAB — CBC WITH DIFFERENTIAL/PLATELET
Basophils Absolute: 0.1 10*3/uL (ref 0.0–0.1)
Basophils Relative: 1 % (ref 0–1)
Eosinophils Absolute: 0.2 10*3/uL (ref 0.0–0.7)
Eosinophils Relative: 4 % (ref 0–5)
HEMATOCRIT: 41.3 % (ref 36.0–46.0)
Hemoglobin: 13.9 g/dL (ref 12.0–15.0)
Lymphocytes Relative: 48 % — ABNORMAL HIGH (ref 12–46)
Lymphs Abs: 2.6 10*3/uL (ref 0.7–4.0)
MCH: 32 pg (ref 26.0–34.0)
MCHC: 33.7 g/dL (ref 30.0–36.0)
MCV: 95.2 fL (ref 78.0–100.0)
MPV: 10.9 fL (ref 8.6–12.4)
Monocytes Absolute: 0.7 10*3/uL (ref 0.1–1.0)
Monocytes Relative: 13 % — ABNORMAL HIGH (ref 3–12)
Neutro Abs: 1.9 10*3/uL (ref 1.7–7.7)
Neutrophils Relative %: 34 % — ABNORMAL LOW (ref 43–77)
PLATELETS: 266 10*3/uL (ref 150–400)
RBC: 4.34 MIL/uL (ref 3.87–5.11)
RDW: 12.9 % (ref 11.5–15.5)
WBC: 5.5 10*3/uL (ref 4.0–10.5)

## 2015-02-15 LAB — LIPID PANEL
Cholesterol: 223 mg/dL — ABNORMAL HIGH (ref 0–200)
HDL: 98 mg/dL (ref >=46)
LDL CALC: 106 mg/dL — AB (ref 0–99)
Total CHOL/HDL Ratio: 2.3 Ratio
Triglycerides: 93 mg/dL (ref <150)
VLDL: 19 mg/dL (ref 0–40)

## 2015-02-15 LAB — POCT URINALYSIS DIPSTICK
Bilirubin, UA: NEGATIVE
Glucose, UA: NEGATIVE
Ketones, UA: NEGATIVE
Nitrite, UA: NEGATIVE
PROTEIN UA: NEGATIVE
Spec Grav, UA: 1.015
UROBILINOGEN UA: 0.2
pH, UA: 7

## 2015-02-15 LAB — TSH: TSH: 0.021 u[IU]/mL — AB (ref 0.350–4.500)

## 2015-02-15 LAB — T4, FREE: FREE T4: 1.48 ng/dL (ref 0.80–1.80)

## 2015-02-15 NOTE — Progress Notes (Addendum)
Subjective:  This chart was scribed for Darlyne Russian, MD by Tamsen Roers, at [facility].  This patient was seen in room 22 and the patient's care was started at 9:28 AM.    Patient ID: Cathy Melendez, female    DOB: Jun 10, 1946, 69 y.o.   MRN: [id number]  HPI  HPI Comments: Cathy Melendez is a 69 y.o. female who presents to [facility] for an annual physical examination. Patient goes to [facility] for her Fluticasone nasal spray. Patient had her colonoscopy last year for a suspicious polyp.  Patient sees her eye doctor once a year and went through her cardiac evaluation 1 year ago.She is up to date with her mammograms and is going to her dermatologist next week.  Patient walks several times a week, uses the treadmill, and has started doing yoga. Patients mother died at the age of 28 from breast cancer.   Eustachian Tube Dysfuntion: Patient notes of a lot of drainage which she has had for years (mostly in her right ear) and when this occurs, she notes she has chest tightness.  Patient denies any chest tightness on exertion. She also notes of skipped beats (which she relates at times increased caffeine).  She denies any skipped beats when she has not consumed a lot of caffeine.  Pelvic pain/ pain with intercourse: Patient notes that she has vaginal tightness and dryness along with dull aching pelvic pain intermittently the last couple months which she has never had in the past before.  Patient has had a hysterectomy.      Patient Active Problem List   Diagnosis Date Noted  . Skin cancer 01/19/2013  . Chronic rhinitis 09/01/2012  . ETD (eustachian tube dysfunction) 08/04/2012  . Deviated nasal septum 08/04/2012  . Postmenopausal 02/04/2012  . Hypothyroid 01/31/2012  . Anxiety 01/31/2012   Past Medical History  Diagnosis Date  . Allergy   . Anemia   . Anxiety   . Asthma     per patient only one time  . Cancer skin  . Thyroid disease       per patient take Synthroid   Past Surgical History  Procedure Laterality Date  . Abdominal hysterectomy      Partial  . Breast surgery      nodule biopsy   Allergies  Allergen Reactions  . Septra [Bactrim] Other (See Comments)    flushing  . Tetracyclines & Related Nausea Only   Prior to Admission medications   Medication Sig Start Date End Date Taking? Authorizing Provider  CALCIUM PO Take by mouth daily.    Historical Provider, MD  estrogens, conjugated, (PREMARIN) 0.3 MG tablet Take 1/2 tablet daily. Patient not taking: Reported on 11/20/2014 01/19/13   Barton Fanny, MD  fluticasone [facility]) 50 MCG/ACT nasal spray Place 2 sprays into the nose daily. 04/20/12 04/20/13  Argentina Donovan, PA-C  Multiple Vitamin (MULTI-VITAMIN DAILY PO) Take by mouth daily.    Historical Provider, MD  PREMARIN 0.3 MG tablet TAKE AS DIRECTED BY MD Patient not taking: Reported on 11/20/2014 05/23/13   Theda Sers, PA-C  sertraline (ZOLOFT) 25 MG tablet Take 0.5 tablets (12.5 mg total) by mouth daily. PATIENT NEEDS OFFICE VISIT FOR ADDITIONAL REFILLS 02/09/15   Darlyne Russian, MD  SYNTHROID 88 MCG tablet Take 1 tablet daily 11/20/14   Darlyne Russian, MD   History   Social History  . Marital Status: Married    Spouse Name:  N/A  . Number of Children: N/A  . Years of Education: N/A   Occupational History  . Part time Psychologist    Social History Main Topics  . Smoking status: Former Smoker -- 3 years    Types: Cigarettes    Quit date: 01/02/1972  . Smokeless tobacco: Never Used     Comment: per pt a social smoker, also not sure the year stopped  . Alcohol Use: Yes     Comment: glass of wine daily  . Drug Use: No  . Sexual Activity: Not on file   Other Topics Concern  . Not on file   Social History Narrative   Married. Education: [employer]. Exercise: yes.     Review of Systems  Constitutional: Negative for fever and chills.  HENT: Positive for ear discharge. Negative for  nosebleeds.   Respiratory: Positive for chest tightness. Negative for cough.   Cardiovascular: Positive for palpitations.  Gastrointestinal: Negative for nausea, vomiting and diarrhea.  Genitourinary: Positive for pelvic pain.       Objective:   Physical Exam CONSTITUTIONAL: Well developed/well nourished HEAD: Normocephalic/atraumatic EYES: EOMI/PERRL ENMT: Mucous membranes moist NECK: supple no meningeal signs SPINE/BACK:entire spine nontender CV: S1/S2 noted, no murmurs/rubs/gallops noted LUNGS: Lungs are clear to auscultation bilaterally, no apparent distress ABDOMEN: soft, nontender, no rebound or guarding, bowel sounds noted throughout abdomen GU:no cva tenderness NEURO: Pt is awake/alert/appropriate, moves all extremitiesx4.  No facial droop.   EXTREMITIES: pulses normal/equal, full ROM SKIN: warm, color normal PSYCH: no abnormalities of mood noted, alert and oriented to situation PELVIC: there is a walnut sized mass in the area of the left ovary. No other masses are palpable. There is definite atrophic change of the vaginal mucosa    Filed Vitals:   02/15/15 0906  BP: 107/65  Pulse: 79  Temp: 97.7 F (36.5 C)  Resp: 16  Height: 5' 6.5" (1.689 m)  Weight: 128 lb (58.06 kg)  SpO2: 99%   Results for orders placed or performed in visit on 11/20/14  T4, free  Result Value Ref Range   Free T4 1.36 0.80 - 1.80 ng/dL  TSH  Result Value Ref Range   TSH 0.374 0.350 - 4.500 uIU/mL   Results for orders placed or performed in visit on 02/15/15  POCT urinalysis dipstick  Result Value Ref Range   Color, UA yellow    Clarity, UA clear    Glucose, UA neg    Bilirubin, UA neg    Ketones, UA neg    Spec Grav, UA 1.015    Blood, UA trace    pH, UA 7.0    Protein, UA neg    Urobilinogen, UA 0.2    Nitrite, UA neg    Leukocytes, UA Trace    EKG frequent PVCs otherwise unremarkable. The voltage is somewhat decreased.      Assessment & Plan:  Routine labs were done  today. Referral made to GYN and pelvic ultrasound ordered for suspected mass in the left adnexa. CA 125 was also done. She had a chest x-ray done 2014. She did have frequent PVCs by examination and on her EKG but these are asymptomatic. She has seen Dr. Einar Gip in the past. She needs evaluation of her left adnexal mass. Routine labs were done. She is in good health follows a healthy lifestyle.I personally performed the services described in this documentation, which was scribed in my presence. The recorded information has been reviewed and is accurate.

## 2015-02-16 ENCOUNTER — Other Ambulatory Visit: Payer: Self-pay | Admitting: Emergency Medicine

## 2015-02-16 ENCOUNTER — Telehealth: Payer: Self-pay | Admitting: Family Medicine

## 2015-02-16 DIAGNOSIS — R1900 Intra-abdominal and pelvic swelling, mass and lump, unspecified site: Secondary | ICD-10-CM

## 2015-02-16 DIAGNOSIS — R102 Pelvic and perineal pain: Secondary | ICD-10-CM

## 2015-02-16 NOTE — Telephone Encounter (Signed)
Called pharmacy to change med to Synthroid 0.075 with 11 refills

## 2015-02-19 LAB — PATHOLOGIST SMEAR REVIEW

## 2015-02-19 LAB — CA 125: CA 125: 9 U/mL (ref <35)

## 2015-02-20 ENCOUNTER — Ambulatory Visit
Admission: RE | Admit: 2015-02-20 | Discharge: 2015-02-20 | Disposition: A | Discharge status: Home / Self Care | Payer: Medicare PPO | Source: Ambulatory Visit | Attending: Emergency Medicine | Admitting: Emergency Medicine

## 2015-02-20 DIAGNOSIS — R1900 Intra-abdominal and pelvic swelling, mass and lump, unspecified site: Secondary | ICD-10-CM

## 2015-02-20 DIAGNOSIS — R102 Pelvic and perineal pain: Secondary | ICD-10-CM

## 2015-02-23 ENCOUNTER — Other Ambulatory Visit: Payer: Self-pay

## 2015-02-27 DIAGNOSIS — N952 Postmenopausal atrophic vaginitis: Secondary | ICD-10-CM | POA: Diagnosis not present

## 2015-02-27 DIAGNOSIS — R1032 Left lower quadrant pain: Secondary | ICD-10-CM | POA: Diagnosis not present

## 2015-03-07 ENCOUNTER — Ambulatory Visit (INDEPENDENT_AMBULATORY_CARE_PROVIDER_SITE_OTHER): Payer: Medicare PPO | Admitting: Emergency Medicine

## 2015-03-07 VITALS — BP 110/62 | HR 73 | Temp 97.7°F | Resp 18 | Ht 66.5 in | Wt 128.2 lb

## 2015-03-07 DIAGNOSIS — I493 Ventricular premature depolarization: Secondary | ICD-10-CM

## 2015-03-07 DIAGNOSIS — Q249 Congenital malformation of heart, unspecified: Secondary | ICD-10-CM | POA: Diagnosis not present

## 2015-03-07 NOTE — Patient Instructions (Signed)
Premature Beats A premature beat is an extra heartbeat that happens earlier than normal. Premature beats are called premature atrial contractions (PACs) or premature ventricular contractions (PVCs) depending on the area of the heart where they start. CAUSES  Premature beats may be brought on by a variety of factors including:  Emotional stress.  Lack of sleep.  Caffeine.  Asthma medicines.  Stimulants.  Herbal teas.  Dietary supplements.  Alcohol. In most cases, premature beats are not dangerous and are not a sign of serious heart disease. Most patients evaluated for premature beats have completely normal heart function. Rarely, premature beats may be a sign of more significant heart problems or medical illness. SYMPTOMS  Premature beats may cause palpitations. This means you feel like your heart is skipping a beat or beating harder than usual. Sometimes, slight chest pain occurs with premature beats, lasting only a few seconds. This pain has been described as a "flopping" feeling inside the chest. In many cases, premature beats do not cause any symptoms and they are only detected when an electrocardiography test (EKG) or heart monitoring is performed. DIAGNOSIS  Your caregiver may run some tests to evaluate your heart such as an EKG or echocardiography. You may need to wear a portable heart monitor for several days to record the electrical activity of your heart. Blood testing may also be performed to check your electrolytes and thyroid function. TREATMENT  Premature beats usually go away with rest. If the problem continues, your caregiver will determine a treatment plan for you.  HOME CARE INSTRUCTIONS  Get plenty of rest over the next few days until your symptoms improve.  Avoid coffee, tea, alcohol, and soda (pop, cola).  Do not smoke. SEEK MEDICAL CARE IF:  Your symptoms continue after 1 to 2 days of rest.  You have new symptoms, such as chest pain or trouble  breathing. SEEK IMMEDIATE MEDICAL CARE IF:  You have severe chest pain or abdominal pain.  You have pain that radiates into the neck, arm, or jaw.  You faint or have extreme weakness.  You have shortness of breath.  Your heartbeat races for more than 5 seconds. MAKE SURE YOU:  Understand these instructions.  Will watch your condition.  Will get help right away if you are not doing well or get worse. Document Released: 01/01/2005 Document Revised: 02/16/2012 Document Reviewed: 07/28/2011 [facility] Patient Information 2015 [place], [place]. This information is not intended to replace advice given to you by your health care provider. Make sure you discuss any questions you have with your health care provider.

## 2015-03-07 NOTE — Progress Notes (Signed)
[facility] [address], [place] [address] [phone]  Date:  03/07/2015   Name:  Cathy Melendez   DOB:  1946-06-25   MRN:  [id number]  PCP:  Jenny Reichmann, MD    Chief Complaint: Irregular Heart Beat   History of Present Illness:  Cathy Melendez is a 69 y.o. very pleasant female patient who presents with the following:  pateint has a history of previously evaluated ectopy. Says she frequently feels "skips" that can come as often as trigeminy. Sometimes associated with some shortness of breath at rest. No chest pain or edema.  No radiation of pain. No improvement with over the counter medications or other home remedies.  Denies other complaint or health concern today.   Patient Active Problem List   Diagnosis Date Noted  . PVC (premature ventricular contraction) 02/15/2015  . Skin cancer 01/19/2013  . Chronic rhinitis 09/01/2012  . ETD (eustachian tube dysfunction) 08/04/2012  . Deviated nasal septum 08/04/2012  . Postmenopausal 02/04/2012  . Hypothyroid 01/31/2012  . Anxiety 01/31/2012    Past Medical History  Diagnosis Date  . Allergy   . Anemia   . Anxiety   . Asthma     per patient only one time  . Cancer skin  . Thyroid disease     per patient take Synthroid    Past Surgical History  Procedure Laterality Date  . Abdominal hysterectomy      Partial  . Breast surgery      nodule biopsy    History  Substance Use Topics  . Smoking status: Former Smoker -- 3 years    Types: Cigarettes    Quit date: 01/02/1972  . Smokeless tobacco: Never Used     Comment: per pt a social smoker, also not sure the year stopped  . Alcohol Use: Yes     Comment: glass of wine daily    Family History  Problem Relation Age of Onset  . Cancer Mother     breast cancer  . Cancer Father     lung cancer  . Heart disease Maternal Grandmother     Allergies  Allergen Reactions  . Septra [Bactrim] Other (See Comments)    flushing  .  Tetracyclines & Related Nausea Only    Medication list has been reviewed and updated.  Current Outpatient Prescriptions on File Prior to Visit  Medication Sig Dispense Refill  . CALCIUM PO Take by mouth daily.    . Multiple Vitamin (MULTI-VITAMIN DAILY PO) Take by mouth daily.    . sertraline (ZOLOFT) 25 MG tablet Take 0.5 tablets (12.5 mg total) by mouth daily. PATIENT NEEDS OFFICE VISIT FOR ADDITIONAL REFILLS 30 tablet 0  . SYNTHROID 88 MCG tablet Take 1 tablet daily (Patient taking differently: 0.075 mcg. Take 1 tablet daily) 30 tablet 11  . fluticasone (FLONASE) 50 MCG/ACT nasal spray Place 2 sprays into the nose daily. 16 g 6   No current facility-administered medications on file prior to visit.    Review of Systems:  As per HPI, otherwise negative.    Physical Examination: Filed Vitals:   03/07/15 1500  BP: 110/62  Pulse: 73  Temp: 97.7 F (36.5 C)  Resp: 18   Filed Vitals:   03/07/15 1500  Height: 5' 6.5" (1.689 m)  Weight: 128 lb 3.2 oz (58.151 kg)   Body mass index is 20.38 kg/(m^2). Ideal Body Weight: Weight in (lb) to have BMI = 25: 156.9  GEN: WDWN, NAD, Non-toxic,  A & O x 3 HEENT: Atraumatic, Normocephalic. Neck supple. No masses, No LAD. Ears and Nose: No external deformity. CV: RRR, No M/G/R. No JVD. No thrill. No extra heart sounds. PULM: CTA B, no wheezes, crackles, rhonchi. No retractions. No resp. distress. No accessory muscle use. ABD: S, NT, ND, +BS. No rebound. No HSM. EXTR: No c/c/e NEURO Normal gait.  PSYCH: Normally interactive. Conversant. Not depressed or anxious appearing.  Calm demeanor.    Assessment and Plan: Palpitations Dr Nadyne Coombes  Signed,  Ellison Carwin, MD

## 2015-03-09 ENCOUNTER — Encounter: Payer: Self-pay | Admitting: Emergency Medicine

## 2015-05-21 NOTE — Addendum Note (Signed)
Addended by: Constance Goltz on: 05/21/2015 03:52 PM   Modules accepted: Miquel Dunn

## 2015-05-31 ENCOUNTER — Ambulatory Visit (INDEPENDENT_AMBULATORY_CARE_PROVIDER_SITE_OTHER): Payer: Medicare PPO

## 2015-05-31 ENCOUNTER — Ambulatory Visit (INDEPENDENT_AMBULATORY_CARE_PROVIDER_SITE_OTHER): Payer: Medicare PPO | Admitting: Emergency Medicine

## 2015-05-31 ENCOUNTER — Encounter: Payer: Self-pay | Admitting: Emergency Medicine

## 2015-05-31 VITALS — BP 127/78 | HR 73 | Temp 98.2°F | Resp 16 | Ht 66.5 in | Wt 128.4 lb

## 2015-05-31 DIAGNOSIS — M549 Dorsalgia, unspecified: Secondary | ICD-10-CM

## 2015-05-31 MED ORDER — HYDROCODONE-ACETAMINOPHEN 5-325 MG PO TABS: ORAL_TABLET | ORAL | Status: DC

## 2015-05-31 NOTE — Progress Notes (Signed)
Subjective:  This chart was scribed for Darlyne Russian, MD by Tamsen Roers, at [facility].  This patient was seen in room 28 and the patient's care was started at 11:25 AM.   Chief Complaint  Patient presents with  . Back Pain    lower right side - x 2-3 wks; digging up bush yesterday - pain got worse     Patient ID: Cathy Melendez, female    DOB: December 08, 1946, 69 y.o.   MRN: [id number]  HPI  HPI Comments: Cathy Melendez is a 69 y.o. female who presents to the [facility] complaining of lower right sided back pain onset 2-3 weeks ago and states that she has always had intermittent back pains over the last couple years.  Yesterday, she was planting and injured herself which caused her back pain to worsen.  Last night, she was having spasms which caused her to wake up in the middle of the night and per husband, she was shaking. The ibuprofen that she took last night "did not touch" her pain so she took 2 of her husbands hydrocodones. She states that now she has gotten up and started walking, she feels better in regards to the pain.  She denies any weakness or numbness associated with the pain.   Patient Active Problem List   Diagnosis Date Noted  . PVC (premature ventricular contraction) 02/15/2015  . Skin cancer 01/19/2013  . Chronic rhinitis 09/01/2012  . ETD (eustachian tube dysfunction) 08/04/2012  . Deviated nasal septum 08/04/2012  . Postmenopausal 02/04/2012  . Hypothyroid 01/31/2012  . Anxiety 01/31/2012   Past Medical History  Diagnosis Date  . Allergy   . Anemia   . Anxiety   . Asthma     per patient only one time  . Cancer skin  . Thyroid disease     per patient take Synthroid   Past Surgical History  Procedure Laterality Date  . Abdominal hysterectomy      Partial  . Breast surgery      nodule biopsy   Allergies  Allergen Reactions  . Tetracyclines & Related Nausea Only    Pt states she can take Doxycycline   .  Septra [Bactrim] Other (See Comments)    flushing   Prior to Admission medications   Medication Sig Start Date End Date Taking? Authorizing Provider  CALCIUM PO Take by mouth daily.    Historical Provider, MD  fluticasone (FLONASE) 50 MCG/ACT nasal spray Place 2 sprays into the nose daily. 04/20/12 04/20/13  Argentina Donovan, PA-C  Multiple Vitamin (MULTI-VITAMIN DAILY PO) Take by mouth daily.    Historical Provider, MD  sertraline (ZOLOFT) 25 MG tablet Take 0.5 tablets (12.5 mg total) by mouth daily. PATIENT NEEDS OFFICE VISIT FOR ADDITIONAL REFILLS 02/09/15   Darlyne Russian, MD  SYNTHROID 88 MCG tablet Take 1 tablet daily Patient taking differently: 0.075 mcg. Take 1 tablet daily 11/20/14   Darlyne Russian, MD   History   Social History  . Marital Status: Married    Spouse Name: N/A  . Number of Children: N/A  . Years of Education: N/A   Occupational History  . Part time Psychologist    Social History Main Topics  . Smoking status: Former Smoker -- 3 years    Types: Cigarettes    Quit date: 01/02/1972  . Smokeless tobacco: Never Used     Comment: per pt a social smoker, also not sure the year stopped  .  Alcohol Use: Yes     Comment: glass of wine daily  . Drug Use: No  . Sexual Activity: Yes   Other Topics Concern  . Not on file   Social History Narrative   Married. Education: [employer]. Exercise: yes.     Current Outpatient Prescriptions on File Prior to Visit  Medication Sig Dispense Refill  . CALCIUM PO Take by mouth daily.    . Multiple Vitamin (MULTI-VITAMIN DAILY PO) Take by mouth daily.    . sertraline (ZOLOFT) 25 MG tablet Take 0.5 tablets (12.5 mg total) by mouth daily. PATIENT NEEDS OFFICE VISIT FOR ADDITIONAL REFILLS 30 tablet 0  . SYNTHROID 88 MCG tablet Take 1 tablet daily (Patient taking differently: 0.075 mcg. Take 1 tablet daily) 30 tablet 11  . fluticasone (FLONASE) 50 MCG/ACT nasal spray Place 2 sprays into the nose daily. 16 g 6   No current  facility-administered medications on file prior to visit.    Allergies  Allergen Reactions  . Tetracyclines & Related Nausea Only    Pt states she can take Doxycycline   . Septra [Bactrim] Other (See Comments)    flushing       Review of Systems  Constitutional: Negative for fever and chills.  Eyes: Negative for pain, discharge and itching.  Gastrointestinal: Negative for nausea and vomiting.  Musculoskeletal: Positive for back pain.  Neurological: Negative for weakness and numbness.       Objective:   Physical Exam CONSTITUTIONAL: Well developed/well nourished HEAD: Normocephalic/atraumatic EYES: EOMI/PERRL ENMT: Mucous membranes moist NECK: supple no meningeal signs SPINE/BACK:She is tender L5 S1 on the right but her reflexes and motor strength are normal.  CV: S1/S2 noted, no murmurs/rubs/gallops noted LUNGS: Lungs are clear to auscultation bilaterally, no apparent distress ABDOMEN: soft, nontender, no rebound or guarding, bowel sounds noted throughout abdomen GU:no cva tenderness NEURO: Pt is awake/alert/appropriate, moves all extremitiesx4.  No facial droop.   EXTREMITIES: pulses normal/equal, full ROM SKIN: warm, color normal PSYCH: no abnormalities of mood noted, alert and oriented to situation  [facility] reading (PRIMARY) by  Dr. Everlene Farrier there are degenerative changes of the lumbar spine but no evidence of compression fracture   Filed Vitals:   05/31/15 1052  BP: 127/78  Pulse: 73  Temp: 98.2 F (36.8 C)  TempSrc: Oral  Resp: 16  Height: 5' 6.5" (1.689 m)  Weight: 128 lb 6.4 oz (58.242 kg)  SpO2: 100%         Assessment & Plan:  Patient will be on Aleve twice a day. She has a few hydrocodone to take a half tablet if needed for severe pain. X-rays sent to radiology for their evaluation. I personally performed the services described in this documentation, which was scribed in my presence. The recorded information has been reviewed and is accurate.  Nena Jordan, MD

## 2015-05-31 NOTE — Patient Instructions (Signed)

## 2015-06-13 ENCOUNTER — Other Ambulatory Visit: Payer: Self-pay | Admitting: Emergency Medicine

## 2015-07-25 ENCOUNTER — Ambulatory Visit (INDEPENDENT_AMBULATORY_CARE_PROVIDER_SITE_OTHER): Payer: Medicare PPO | Admitting: Emergency Medicine

## 2015-07-25 VITALS — BP 118/64 | HR 63 | Temp 97.3°F | Resp 18 | Ht 66.0 in | Wt 127.0 lb

## 2015-07-25 DIAGNOSIS — L237 Allergic contact dermatitis due to plants, except food: Secondary | ICD-10-CM | POA: Diagnosis not present

## 2015-07-25 MED ORDER — BETAMETHASONE DIPROPIONATE 0.05 % EX CREA: TOPICAL_CREAM | Freq: Two times a day (BID) | CUTANEOUS | Status: DC

## 2015-07-25 NOTE — Patient Instructions (Signed)

## 2015-07-25 NOTE — Progress Notes (Signed)
Subjective:  Patient ID: Cathy Melendez, female    DOB: 10/06/1946  Age: 69 y.o. MRN: [id number]  CC: Rash   HPI Cathy Melendez presents   With poison ivy. Cathy Melendez working in the yard now has extensive poison ivy only on her arms. But does have it on her neck abdomen groin and lower extremities. Cathy Melendez is not having any improvement with over-the-counter topical medication. Cathy Melendez is controlling her itching partially with Benadryl  History Cathy Melendez has a past medical history of Allergy; Anemia; Anxiety; Asthma; Cancer (skin); and Thyroid disease.   Cathy Melendez has past surgical history that includes Abdominal hysterectomy and Breast surgery.   Her  family history includes Cancer in her father and mother; Heart disease in her maternal grandmother.  Cathy Melendez   reports that Cathy Melendez quit smoking about 43 years ago. Her smoking use included Cigarettes. Cathy Melendez quit after 3 years of use. Cathy Melendez has never used smokeless tobacco. Cathy Melendez reports that Cathy Melendez drinks alcohol. Cathy Melendez reports that Cathy Melendez does not use illicit drugs.  Outpatient Prescriptions Prior to Visit  Medication Sig Dispense Refill  . CALCIUM PO Take by mouth daily.    . Multiple Vitamin (MULTI-VITAMIN DAILY PO) Take by mouth daily.    . sertraline (ZOLOFT) 25 MG tablet TAKE 1/2 TABLET BY MOUTH DAILY (NEED OFFICE VISIT FOR ADDITIONAL REFILLS) 2ND 30 tablet 0  . SYNTHROID 88 MCG tablet Take 1 tablet daily (Patient taking differently: 0.075 mcg. Take 1 tablet daily) 30 tablet 11  . fluticasone (FLONASE) 50 MCG/ACT nasal spray Place 2 sprays into the nose daily. 16 g 6  . HYDROcodone-acetaminophen (NORCO) 5-325 MG per tablet Take 1/2 every 4 hours as needed for back pain (Patient not taking: Reported on 07/25/2015) 20 tablet 0   No facility-administered medications prior to visit.    Social History   Social History  . Marital Status: Married    Spouse Name: N/A  . Number of Children: N/A  . Years of Education: N/A   Occupational History  . Part time Psychologist      Social History Main Topics  . Smoking status: Former Smoker -- 3 years    Types: Cigarettes    Quit date: 01/02/1972  . Smokeless tobacco: Never Used     Comment: per pt a social smoker, also not sure the year stopped  . Alcohol Use: Yes     Comment: glass of wine daily  . Drug Use: No  . Sexual Activity: Yes   Other Topics Concern  . None   Social History Narrative   Married. Education: [employer]. Exercise: yes.     Review of Systems  Constitutional: Negative for fever, chills and appetite change.  HENT: Negative for congestion, ear pain, postnasal drip, sinus pressure and sore throat.   Eyes: Negative for pain and redness.  Respiratory: Negative for cough, shortness of breath and wheezing.   Cardiovascular: Negative for leg swelling.  Gastrointestinal: Negative for nausea, vomiting, abdominal pain, diarrhea, constipation and blood in stool.  Endocrine: Negative for polyuria.  Genitourinary: Negative for dysuria, urgency, frequency and flank pain.  Musculoskeletal: Negative for gait problem.  Skin: Negative for rash.  Neurological: Negative for weakness and headaches.  Psychiatric/Behavioral: Negative for confusion and decreased concentration. The patient is not nervous/anxious.     Objective:  BP 118/64 mmHg  Pulse 63  Temp(Src) 97.3 F (36.3 C) (Oral)  Resp 18  Ht 5\' 6"  (1.676 m)  Wt 127 lb (57.607 kg)  BMI 20.51 kg/m2  SpO2 96%  Physical  Exam  Constitutional: Cathy Melendez is oriented to person, place, and time. Cathy Melendez appears well-developed and well-nourished.  HENT:  Head: Normocephalic and atraumatic.  Eyes: Conjunctivae are normal. Pupils are equal, round, and reactive to light.  Pulmonary/Chest: Effort normal.  Musculoskeletal: Cathy Melendez exhibits no edema.  Neurological: Cathy Melendez is alert and oriented to person, place, and time.  Skin: Skin is dry. Rash noted.  Psychiatric: Cathy Melendez has a normal mood and affect. Her behavior is normal. Thought content normal.      Assessment  & Plan:   Cathy was seen today for rash.  Diagnoses and all orders for this visit:  Poison ivy  Other orders -     betamethasone dipropionate (DIPROLENE) 0.05 % cream; Apply topically 2 (two) times daily.   I am having Ms. Melendez start on betamethasone dipropionate. I am also having her maintain her Multiple Vitamin (MULTI-VITAMIN DAILY PO), CALCIUM PO, fluticasone, SYNTHROID, HYDROcodone-acetaminophen, and sertraline.  Meds ordered this encounter  Medications  . betamethasone dipropionate (DIPROLENE) 0.05 % cream    Sig: Apply topically 2 (two) times daily.    Dispense:  45 g    Refill:  0    Appropriate red flag conditions were discussed with the patient as well as actions that should be taken.  Patient expressed his understanding.  Follow-up: Return if symptoms worsen or fail to improve.  Roselee Culver, MD

## 2015-08-31 DIAGNOSIS — D225 Melanocytic nevi of trunk: Secondary | ICD-10-CM | POA: Diagnosis not present

## 2015-08-31 DIAGNOSIS — L821 Other seborrheic keratosis: Secondary | ICD-10-CM | POA: Diagnosis not present

## 2015-08-31 DIAGNOSIS — D229 Melanocytic nevi, unspecified: Secondary | ICD-10-CM | POA: Diagnosis not present

## 2015-08-31 DIAGNOSIS — D1801 Hemangioma of skin and subcutaneous tissue: Secondary | ICD-10-CM | POA: Diagnosis not present

## 2015-08-31 DIAGNOSIS — Z85828 Personal history of other malignant neoplasm of skin: Secondary | ICD-10-CM | POA: Diagnosis not present

## 2015-09-11 ENCOUNTER — Encounter: Payer: Self-pay | Admitting: Emergency Medicine

## 2015-10-04 ENCOUNTER — Other Ambulatory Visit: Payer: Self-pay | Admitting: Emergency Medicine

## 2015-10-15 ENCOUNTER — Ambulatory Visit (INDEPENDENT_AMBULATORY_CARE_PROVIDER_SITE_OTHER): Payer: Medicare PPO | Admitting: Family Medicine

## 2015-10-15 VITALS — BP 108/68 | HR 80 | Temp 97.7°F | Resp 16 | Ht 66.0 in | Wt 128.0 lb

## 2015-10-15 DIAGNOSIS — L03011 Cellulitis of right finger: Secondary | ICD-10-CM

## 2015-10-15 DIAGNOSIS — M79644 Pain in right finger(s): Secondary | ICD-10-CM

## 2015-10-15 DIAGNOSIS — IMO0001 Reserved for inherently not codable concepts without codable children: Secondary | ICD-10-CM

## 2015-10-15 MED ORDER — AMOXICILLIN-POT CLAVULANATE 875-125 MG PO TABS: 1.0000 | ORAL_TABLET | Freq: Two times a day (BID) | ORAL | Status: DC

## 2015-10-15 NOTE — Progress Notes (Signed)
Patient ID: Cathy Melendez, female    DOB: [birth year]  Age: 69 y.o. MRN: [id number]  Chief Complaint  Patient presents with  . finger infection    right middle finger, x 5 days     Subjective:  Middle fingers been hurting her for several days. She doesn't know what started it. May have been a hangnail about 5 days ago. Is gotten steadily worse. She is allergic to sulfa drugs and intolerant to old style tetracyclines but can take doxycycline.  Current allergies, medications, problem list, past/family and social histories reviewed.  Objective:  BP 108/68 mmHg  Pulse 80  Temp(Src) 97.7 F (36.5 C) (Oral)  Resp 16  Ht 5\' 6"  (1.676 m)  Wt 128 lb (58.06 kg)  BMI 20.67 kg/m2  SpO2 98%  Finger is red and swollen with a little white area adjacent to the nail. There is a tiny dark spot right adjacent to the nail which I'm not certain what caused it.  Assessment & Plan:   Assessment: 1. Paronychia of third finger, right   2. Pain of finger of right hand       Plan: I&D and antibiotics  Orders Placed This Encounter  Procedures  . Wound culture    Meds ordered this encounter  Medications  . levothyroxine (SYNTHROID, LEVOTHROID) 88 MCG tablet    Sig: Take 0.075 mcg by mouth daily before breakfast.  . amoxicillin-clavulanate (AUGMENTIN) 875-125 MG tablet    Sig: Take 1 tablet by mouth 2 (two) times daily.    Dispense:  20 tablet    Refill:  0         Patient Instructions  Wound care as directed  Return as directed  Augmentin 875 mg one twice daily (amoxicillin/clavulanate) for infection  Ibuprofen 600-800 mg 2 times daily as needed for pain and/or acetaminophen 500 mg 2 pills 3 times daily for pain       Return if symptoms worsen or fail to improve.   HOPPER,DAVID, MD 10/15/2015

## 2015-10-15 NOTE — Progress Notes (Signed)
Procedure: Risk and benefits discussed and verbal consent obtained. The patient was anesthetized using 3 cc of 1:1 mix of 1% lidocaine without epi and Marcaine. A 0.5 cm incision was made with a number 11 and purulent material was expressed.  The wound was not packed. The patient tolerated the procedure without difficulty.   A clean dressing was placed and wound care instructions were provided.  Philis Fendt, MS, PA-C 5:18 PM, 10/15/2015

## 2015-10-15 NOTE — Patient Instructions (Signed)
Wound care as directed  Return as directed  Augmentin 875 mg one twice daily (amoxicillin/clavulanate) for infection  Ibuprofen 600-800 mg 2 times daily as needed for pain and/or acetaminophen 500 mg 2 pills 3 times daily for pain

## 2015-10-18 LAB — WOUND CULTURE
GRAM STAIN: NONE SEEN
Gram Stain: NONE SEEN

## 2015-11-13 ENCOUNTER — Other Ambulatory Visit: Payer: Self-pay | Admitting: Emergency Medicine

## 2015-11-13 NOTE — Telephone Encounter (Signed)
done

## 2015-11-27 DIAGNOSIS — H524 Presbyopia: Secondary | ICD-10-CM | POA: Diagnosis not present

## 2015-11-27 DIAGNOSIS — H5203 Hypermetropia, bilateral: Secondary | ICD-10-CM | POA: Diagnosis not present

## 2015-11-27 DIAGNOSIS — H2513 Age-related nuclear cataract, bilateral: Secondary | ICD-10-CM | POA: Diagnosis not present

## 2015-12-14 ENCOUNTER — Encounter (INDEPENDENT_AMBULATORY_CARE_PROVIDER_SITE_OTHER): Payer: Medicare Other | Admitting: Ophthalmology

## 2015-12-14 DIAGNOSIS — H43813 Vitreous degeneration, bilateral: Secondary | ICD-10-CM

## 2015-12-14 DIAGNOSIS — D3131 Benign neoplasm of right choroid: Secondary | ICD-10-CM | POA: Diagnosis not present

## 2015-12-14 DIAGNOSIS — H2513 Age-related nuclear cataract, bilateral: Secondary | ICD-10-CM | POA: Diagnosis not present

## 2016-01-10 ENCOUNTER — Other Ambulatory Visit: Payer: Self-pay | Admitting: Emergency Medicine

## 2016-03-04 ENCOUNTER — Encounter: Payer: Self-pay | Admitting: Family Medicine

## 2016-03-04 ENCOUNTER — Ambulatory Visit (INDEPENDENT_AMBULATORY_CARE_PROVIDER_SITE_OTHER): Payer: Medicare Other | Admitting: Family Medicine

## 2016-03-04 VITALS — BP 108/74 | HR 81 | Temp 99.0°F | Resp 16 | Ht 66.25 in | Wt 125.8 lb

## 2016-03-04 DIAGNOSIS — E039 Hypothyroidism, unspecified: Secondary | ICD-10-CM

## 2016-03-04 DIAGNOSIS — J309 Allergic rhinitis, unspecified: Secondary | ICD-10-CM

## 2016-03-04 DIAGNOSIS — J069 Acute upper respiratory infection, unspecified: Secondary | ICD-10-CM

## 2016-03-04 LAB — TSH: TSH: 0.11 m[IU]/L — AB

## 2016-03-04 MED ORDER — AMOXICILLIN 875 MG PO TABS: 875.0000 mg | ORAL_TABLET | Freq: Two times a day (BID) | ORAL | Status: DC

## 2016-03-04 NOTE — Patient Instructions (Signed)
For muscle aches, headache, sore throat you can take over the counter acetaminophen or ibuprofen as directed on the package For nasal congestion you can use Afrin nasal spray twice a day for up to 3 days, and /or sudafed, and/or saline nasal spray For cough you can use Delsym cough syrup Please come back to see [place] or go to the emergency department if you are not better in 5 to 7 days or if you develop fever over 101 for more than 48 hours or if you develop wheezing or shortness of breath.   Resume Zyrtec and continue flonase- use afrin prior to flonase

## 2016-03-04 NOTE — Progress Notes (Signed)
Subjective:    Patient ID: Cathy Melendez, female    DOB: 12/21/1945, 70 y.o.   MRN: [id number]  HPI This is a 70 yo female who presents today with 6 days of sore throat which has progressed to a cough and nasal drainage. Has watery eye drainage from left eye. Cough intermittently dry/wet and has some wheezing. Has taken some tylenol, flonase, otc cough drops. Some cough at night. Low grade fever. Feels fatigued. Has not been taking Zyrtec lately.   Had low TSH and had dose lowered, has felt like energy level not as good. Needs to have checked today.   Past Medical History  Diagnosis Date  . Allergy   . Anemia   . Anxiety   . Asthma     per patient only one time  . Cancer ([place]) skin  . Thyroid disease     per patient take Synthroid   Past Surgical History  Procedure Laterality Date  . Abdominal hysterectomy      Partial  . Breast surgery      nodule biopsy   Family History  Problem Relation Age of Onset  . Cancer Mother     breast cancer  . Cancer Father     lung cancer  . Heart disease Maternal Grandmother    Social History  Substance Use Topics  . Smoking status: Former Smoker -- 3 years    Types: Cigarettes    Quit date: 01/02/1972  . Smokeless tobacco: Never Used     Comment: per pt a social smoker, also not sure the year stopped  . Alcohol Use: Yes     Comment: glass of wine daily      Review of Systems  Constitutional: Positive for chills and fatigue.  HENT: Positive for congestion, postnasal drip, rhinorrhea, sinus pressure and sore throat.   Respiratory: Positive for cough. Negative for shortness of breath and wheezing.   Cardiovascular: Negative for chest pain.  Neurological: Positive for headaches.       Objective:   Physical Exam  Constitutional: She is oriented to person, place, and time. She appears well-developed and well-nourished. No distress.  HENT:  Head: Normocephalic and atraumatic.  Right Ear: Tympanic membrane, external ear and ear  canal normal.  Left Ear: Tympanic membrane, external ear and ear canal normal.  Nose: Mucosal edema and rhinorrhea present.  Mouth/Throat: Uvula is midline. Posterior oropharyngeal erythema present. No oropharyngeal exudate or posterior oropharyngeal edema.  Post nasal drainage  Eyes: Conjunctivae are normal.  Neck: Normal range of motion. Neck supple.  Cardiovascular: Normal rate, regular rhythm and normal heart sounds.   Pulmonary/Chest: Effort normal and breath sounds normal.  Musculoskeletal: Normal range of motion.  Neurological: She is alert and oriented to person, place, and time.  Skin: Skin is warm and dry. She is not diaphoretic.  Psychiatric: She has a normal mood and affect. Her behavior is normal. Judgment and thought content normal.  Vitals reviewed.     BP 108/74 mmHg  Pulse 81  Temp(Src) 99 F (37.2 C) (Oral)  Resp 16  Ht 5' 6.25" (1.683 m)  Wt 125 lb 12.8 oz (57.063 kg)  BMI 20.15 kg/m2  SpO2 98% Wt Readings from Last 3 Encounters:  03/04/16 125 lb 12.8 oz (57.063 kg)  10/15/15 128 lb (58.06 kg)  07/25/15 127 lb (57.607 kg)       Assessment & Plan:  1. Acute upper respiratory infection - amoxicillin (AMOXIL) 875 MG tablet; Take 1 tablet (875 mg total)  by mouth 2 (two) times daily.  Dispense: 14 tablet; Refill: 0 - Provided written and verbal information regarding diagnosis and treatment.  2. Hypothyroidism, unspecified hypothyroidism type - TSH - levothyroxine (SYNTHROID, LEVOTHROID) 50 MCG tablet; Take 1 tablet (50 mcg total) by mouth daily before breakfast.  Dispense: 90 tablet; Refill: 1  3. Allergic rhinitis, unspecified allergic rhinitis type - resume Zyrtec, continue flonase daily  - RTC precautions reviewed  Clarene Reamer, FNP-BC  [facility]  03/06/2016 9:25 PM

## 2016-03-06 MED ORDER — LEVOTHYROXINE SODIUM 50 MCG PO TABS: 50.0000 ug | ORAL_TABLET | Freq: Every day | ORAL | Status: DC

## 2016-03-08 ENCOUNTER — Other Ambulatory Visit: Payer: Self-pay | Admitting: Emergency Medicine

## 2016-04-01 ENCOUNTER — Encounter: Payer: Self-pay | Admitting: Family Medicine

## 2016-04-01 ENCOUNTER — Ambulatory Visit (INDEPENDENT_AMBULATORY_CARE_PROVIDER_SITE_OTHER): Payer: Medicare Other | Admitting: Family Medicine

## 2016-04-01 VITALS — BP 106/67 | HR 71 | Temp 98.4°F | Resp 16 | Ht 66.5 in | Wt 125.8 lb

## 2016-04-01 DIAGNOSIS — E034 Atrophy of thyroid (acquired): Secondary | ICD-10-CM

## 2016-04-01 DIAGNOSIS — N941 Unspecified dyspareunia: Secondary | ICD-10-CM

## 2016-04-01 DIAGNOSIS — Z139 Encounter for screening, unspecified: Secondary | ICD-10-CM

## 2016-04-01 DIAGNOSIS — Z1389 Encounter for screening for other disorder: Secondary | ICD-10-CM

## 2016-04-01 DIAGNOSIS — E038 Other specified hypothyroidism: Secondary | ICD-10-CM | POA: Diagnosis not present

## 2016-04-01 DIAGNOSIS — I493 Ventricular premature depolarization: Secondary | ICD-10-CM

## 2016-04-01 DIAGNOSIS — Z8269 Family history of other diseases of the musculoskeletal system and connective tissue: Secondary | ICD-10-CM | POA: Diagnosis not present

## 2016-04-01 DIAGNOSIS — Z0000 Encounter for general adult medical examination without abnormal findings: Secondary | ICD-10-CM | POA: Diagnosis not present

## 2016-04-01 DIAGNOSIS — E7800 Pure hypercholesterolemia, unspecified: Secondary | ICD-10-CM

## 2016-04-01 LAB — POC MICROSCOPIC URINALYSIS (UMFC): MUCUS RE: ABSENT

## 2016-04-01 LAB — POCT URINALYSIS DIP (MANUAL ENTRY)
Bilirubin, UA: NEGATIVE
Blood, UA: NEGATIVE
Glucose, UA: NEGATIVE
Ketones, POC UA: NEGATIVE
LEUKOCYTES UA: NEGATIVE
Nitrite, UA: NEGATIVE
PROTEIN UA: NEGATIVE
SPEC GRAV UA: 1.01
Urobilinogen, UA: 0.2
pH, UA: 7

## 2016-04-01 NOTE — Patient Instructions (Addendum)
Try your premarin cream samples 2-3x a week, let me know if you need a refill  Keeping You Healthy  Get These Tests  Blood Pressure- Have your blood pressure checked by your healthcare provider at least once a year.  Normal blood pressure is 120/80.  Weight- Have your body mass index (BMI) calculated to screen for obesity.  BMI is a measure of body fat based on height and weight.  You can calculate your own BMI at [employer].it  Cholesterol- Have your cholesterol checked every year.  Diabetes- Have your blood sugar checked every year if you have high blood pressure, high cholesterol, a family history of diabetes or if you are overweight.  Pap Test - Have a pap test every 1 to 5 years if you have been sexually active.  If you are older than 65 and recent pap tests have been normal you may not need additional pap tests.  In addition, if you have had a hysterectomy  for benign disease additional pap tests are not necessary.  Mammogram-Yearly mammograms are essential for early detection of breast cancer  Screening for Colon Cancer- Colonoscopy starting at age 69. Screening may begin sooner depending on your family history and other health conditions.  Follow up colonoscopy as directed by your Gastroenterologist.  Screening for Osteoporosis- Screening begins at age 22 with bone density scanning, sooner if you are at higher risk for developing Osteoporosis.  Get these medicines  Calcium with Vitamin D- Your body requires 1200-1500 mg of Calcium a day and [phone] IU of Vitamin D a day.  You can only absorb 500 mg of Calcium at a time therefore Calcium must be taken in 2 or 3 separate doses throughout the day.  Hormones- Hormone therapy has been associated with increased risk for certain cancers and heart disease.  Talk to your healthcare provider about if you need relief from menopausal symptoms.  Aspirin- Ask your healthcare provider about taking Aspirin to prevent Heart Disease  and Stroke.  Get these Immuniztions  Flu shot- Every fall  Pneumonia shot- Once after the age of 24; if you are younger ask your healthcare provider if you need a pneumonia shot.  Tetanus- Every ten years.  Zostavax- Once after the age of 34 to prevent shingles.  Take these steps  Don't smoke- Your healthcare provider can help you quit. For tips on how to quit, ask your healthcare provider or go to www.smokefree.gov or call 1-800 QUIT-NOW.  Be physically active- Exercise 5 days a week for a minimum of 30 minutes.  If you are not already physically active, start slow and gradually work up to 30 minutes of moderate physical activity.  Try walking, dancing, bike riding, swimming, etc.  Eat a healthy diet- Eat a variety of healthy foods such as fruits, vegetables, whole grains, low fat milk, low fat cheeses, yogurt, lean meats, chicken, fish, eggs, dried beans, tofu, etc.  For more information go to www.thenutritionsource.org  Dental visit- Brush and floss teeth twice daily; visit your dentist twice a year.  Eye exam- Visit your Optometrist or Ophthalmologist yearly.  Drink alcohol in moderation- Limit alcohol intake to one drink or less a day.  Never drink and drive.  Depression- Your emotional health is as important as your physical health.  If you're feeling down or losing interest in things you normally enjoy, please talk to your healthcare provider.  Seat Belts- can save your life; always wear one  Smoke/Carbon Monoxide detectors- These detectors need to be installed on  the appropriate level of your home.  Replace batteries at least once a year.  Violence- If anyone is threatening or hurting you, please tell your healthcare provider.  Living Will/ Health care power of attorney- Discuss with your healthcare provider and family.    IF you received an x-ray today, you will receive an invoice from [facility]. Please contact [facility] at [phone] with  questions or concerns regarding your invoice.   IF you received labwork today, you will receive an invoice from [employer]. Please contact [facility] at [phone] with questions or concerns regarding your invoice.   Our billing staff will not be able to assist you with questions regarding bills from these companies.  You will be contacted with the lab results as soon as they are available. The fastest way to get your results is to activate your My Chart account. Instructions are located on the last page of this paperwork. If you have not heard from [place] regarding the results in 2 weeks, please contact this office.

## 2016-04-01 NOTE — Progress Notes (Signed)
Subjective:    Patient ID: Cathy Melendez, female    DOB: 09/28/1946, 70 y.o.   MRN: [id number]  HPI This is a pleasant 70 yo female who presents for CPE. She has been doing well with just a few minor issues that she thinks are a function of aging. She is married and both she and her husband are retired. She enjoys gardening, exercising and getting together with her friends. Her son has been diagnosed with ankylosing spondylosis and she would like to know if she is a carrier of HLA- B27.   Last CPE- 02/15/15 Mammo- 02/20/15 Pap- hysterectomy, sees gyn Colonoscopy- 01/11/2008 Tdap- 02/02/12 Prevnar- 02/15/15 Flu-annual Eye- annual Dental- bi-anual Exercise- almost daily   Past Medical History  Diagnosis Date  . Allergy   . Anemia   . Anxiety   . Asthma     per patient only one time  . Cancer ([place]) skin  . Thyroid disease     per patient take Synthroid   Past Surgical History  Procedure Laterality Date  . Abdominal hysterectomy      Partial  . Breast surgery      nodule biopsy   Family History  Problem Relation Age of Onset  . Cancer Mother     breast cancer  . Cancer Father     lung cancer  . Heart disease Maternal Grandmother    Social History  Substance Use Topics  . Smoking status: Former Smoker -- 3 years    Types: Cigarettes    Quit date: 01/02/1972  . Smokeless tobacco: Never Used     Comment: per pt a social smoker, also not sure the year stopped  . Alcohol Use: Yes     Comment: glass of wine daily     Review of Systems  Constitutional: Positive for fatigue.  HENT: Positive for postnasal drip and rhinorrhea.   Eyes: Negative.   Cardiovascular: Negative.   Gastrointestinal: Positive for abdominal pain (occasional GERD symptoms).  Endocrine: Negative.   Genitourinary: Positive for vaginal pain and dyspareunia (was given Premarin cream by gyn, but didn't use).  Skin: Negative.   Allergic/Immunologic: Positive for environmental allergies.  Neurological:  Negative.   Hematological: Negative.   Psychiatric/Behavioral: Negative.        Objective:   Physical Exam Physical Exam  Constitutional: She is oriented to person, place, and time. She appears well-developed and well-nourished. No distress.  HENT:  Head: Normocephalic and atraumatic.  Right Ear: External ear normal.  Left Ear: External ear normal.  Nose: Nose normal.  Mouth/Throat: Oropharynx is clear and moist. No oropharyngeal exudate.  Eyes: Conjunctivae are normal. Pupils are equal, round, and reactive to light.  Neck: Normal range of motion. Neck supple. No JVD present. No thyromegaly present.  Cardiovascular: Normal rate, regular rhythm, normal heart sounds and intact distal pulses.   Pulmonary/Chest: Effort normal and breath sounds normal. Right breast exhibits no inverted nipple, no mass, no nipple discharge, no skin change and no tenderness. Left breast exhibits no inverted nipple, no mass, no nipple discharge, no skin change and no tenderness. Breasts are symmetrical.  Abdominal: Soft. Bowel sounds are normal. She exhibits no distension and no mass. There is no tenderness. There is no rebound and no guarding.  Musculoskeletal: Normal range of motion. She exhibits no edema or tenderness.  Lymphadenopathy:    She has no cervical adenopathy.  Neurological: She is alert and oriented to person, place, and time. She has normal reflexes.  Skin: Skin is warm  and dry. She is not diaphoretic.  Psychiatric: She has a normal mood and affect. Her behavior is normal. Judgment and thought content normal.  Vitals reviewed.  BP 106/67 mmHg  Pulse 71  Temp(Src) 98.4 F (36.9 C) (Oral)  Resp 16  Ht 5' 6.5" (1.689 m)  Wt 125 lb 12.8 oz (57.063 kg)  BMI 20.00 kg/m2 Depression screen [facility] 2/9 04/01/2016 03/04/2016 10/15/2015 07/25/2015 05/31/2015  Decreased Interest 0 0 0 0 0  Down, Depressed, Hopeless 0 0 0 0 0  PHQ - 2 Score 0 0 0 0 0        Assessment & Plan:  1. Annual physical  exam -- Discussed and encouraged healthy lifestyle choices- adequate sleep, regular exercise, stress management and healthy food choices.   2. 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 PVC (premature ventricular contraction) - CBC - COMPLETE METABOLIC PANEL WITH GFR  4. Elevated cholesterol - Lipid panel - COMPLETE METABOLIC PANEL WITH GFR  5. Screening for hematuria or proteinuria - POCT urinalysis dipstick - POCT Microscopic Urinalysis ([facility])  6. Family history of ankylosing spondylitis - HLA-B27 antigen  7. Dyspareunia in female - discussed age related changes and encouraged patient to try Premarin cream that she has at home  - follow up in 6 months  Clarene Reamer, FNP-BC  [facility]  04/04/2016 6:33 AM

## 2016-04-02 LAB — COMPLETE METABOLIC PANEL WITH GFR
ALT: 13 U/L (ref 6–29)
AST: 20 U/L (ref 10–35)
Albumin: 4.2 g/dL (ref 3.6–5.1)
Alkaline Phosphatase: 47 U/L (ref 33–130)
BUN: 13 mg/dL (ref 7–25)
CO2: 19 mmol/L — AB (ref 20–31)
CREATININE: 0.7 mg/dL (ref 0.60–0.93)
Calcium: 9.3 mg/dL (ref 8.6–10.4)
Chloride: 108 mmol/L (ref 98–110)
GFR, Est African American: >89 mL/min (ref >=60)
GFR, Est Non African American: 88 mL/min (ref >=60)
GLUCOSE: 87 mg/dL (ref 65–99)
Potassium: 4.3 mmol/L (ref 3.5–5.3)
SODIUM: 142 mmol/L (ref 135–146)
Total Bilirubin: 0.9 mg/dL (ref 0.2–1.2)
Total Protein: 7.1 g/dL (ref 6.1–8.1)

## 2016-04-02 LAB — CBC
HEMATOCRIT: 42.9 % (ref 35.0–45.0)
Hemoglobin: 14.3 g/dL (ref 11.7–15.5)
MCH: 32.7 pg (ref 27.0–33.0)
MCHC: 33.3 g/dL (ref 32.0–36.0)
MCV: 98.2 fL (ref 80.0–100.0)
MPV: 11 fL (ref 7.5–12.5)
Platelets: 245 10*3/uL (ref 140–400)
RBC: 4.37 MIL/uL (ref 3.80–5.10)
RDW: 13 % (ref 11.0–15.0)
WBC: 5.7 10*3/uL (ref 3.8–10.8)

## 2016-04-02 LAB — LIPID PANEL
Cholesterol: 249 mg/dL — ABNORMAL HIGH (ref 125–200)
HDL: 101 mg/dL (ref >=46)
LDL CALC: 123 mg/dL (ref <130)
Total CHOL/HDL Ratio: 2.5 Ratio (ref <=5.0)
Triglycerides: 125 mg/dL (ref <150)
VLDL: 25 mg/dL (ref <30)

## 2016-04-02 LAB — TSH: TSH: 1.56 mIU/L

## 2016-04-04 LAB — HLA-B27 ANTIGEN: DNA RESULT: NEGATIVE

## 2016-04-11 ENCOUNTER — Telehealth: Payer: Self-pay

## 2016-04-11 NOTE — Telephone Encounter (Signed)
SPoke with pt, advised lab results.

## 2016-04-11 NOTE — Telephone Encounter (Signed)
PATIENT STATES SHE WAS IN TO SEE DEBBIE GESSNER LAST TUES. FOR HER PHYSICAL. SHE SAID SHE HAS NOT HEARD ANYTHING ON HER LAB RESULTS. BEST PHONE [phone] (CELL) PLEASE LEAVE HER A MESSAGE IF SHE DOES NOT ANSWER.  [place] ON [address]. [place]

## 2016-05-04 ENCOUNTER — Other Ambulatory Visit: Payer: Self-pay | Admitting: Physician Assistant

## 2016-05-07 NOTE — Telephone Encounter (Signed)
Cathy Melendez, pt had annual end of April, but I don't see depression discussed. Do you want to give RFs?

## 2016-07-17 ENCOUNTER — Ambulatory Visit (INDEPENDENT_AMBULATORY_CARE_PROVIDER_SITE_OTHER): Payer: Medicare Other | Admitting: Family Medicine

## 2016-07-17 VITALS — BP 110/66 | HR 74 | Temp 98.3°F | Resp 18 | Ht 66.5 in | Wt 124.0 lb

## 2016-07-17 DIAGNOSIS — R05 Cough: Secondary | ICD-10-CM

## 2016-07-17 DIAGNOSIS — R059 Cough, unspecified: Secondary | ICD-10-CM

## 2016-07-17 DIAGNOSIS — R0982 Postnasal drip: Secondary | ICD-10-CM

## 2016-07-17 MED ORDER — BENZONATATE 100 MG PO CAPS: 100.0000 mg | ORAL_CAPSULE | Freq: Three times a day (TID) | ORAL | 0 refills | Status: DC | PRN

## 2016-07-17 NOTE — Progress Notes (Signed)
Patient ID: Cathy Melendez, female    DOB: 06-Apr-1946, 70 y.o.   MRN: [id number]  PCP: Jenny Reichmann, MD  Chief Complaint  Patient presents with  . Cough  . Nasal Congestion    Subjective:   HPI Presents for evaluation of mildly intermittent productive cough times 6 weeks. Reports increased intensity of cough this week. Cough awakens from sleep.  She feel a sensation of tightness of right lung with cough. She reports no measurable fever, clear nasal mucus, some fatigue, and post nasal drip. Denies fever, chills, or headache.  . Social History   Social History  . Marital status: Married    Spouse name: N/A  . Number of children: N/A  . Years of education: N/A   Occupational History  . Part time Psychologist    Social History Main Topics  . Smoking status: Former Smoker    Years: 3.00    Types: Cigarettes    Quit date: 01/02/1972  . Smokeless tobacco: Never Used     Comment: per pt a social smoker, also not sure the year stopped  . Alcohol use Yes     Comment: glass of wine daily  . Drug use: No  . Sexual activity: Yes   Other Topics Concern  . Not on file   Social History Narrative   Married. Education: [employer]. Exercise: yes.   . Family History  Problem Relation Age of Onset  . Cancer Mother     breast cancer  . Cancer Father     lung cancer  . Heart disease Maternal Grandmother     Review of Systems  Constitutional: Positive for fatigue.  HENT: Positive for postnasal drip and sore throat.        See HPI  Respiratory: Positive for cough.        See HPI  Cardiovascular: Negative.        Patient Active Problem List   Diagnosis Date Noted  . PVC (premature ventricular contraction) 02/15/2015  . Skin cancer 01/19/2013  . Chronic rhinitis 09/01/2012  . ETD (eustachian tube dysfunction) 08/04/2012  . Deviated nasal septum 08/04/2012  . Postmenopausal 02/04/2012  . Hypothyroid 01/31/2012  . Anxiety 01/31/2012     Prior to Admission  medications   Medication Sig Start Date End Date Taking? Authorizing Provider  CALCIUM PO Take by mouth daily.   Yes Historical Provider, MD  levothyroxine (SYNTHROID, LEVOTHROID) 50 MCG tablet Take 1 tablet (50 mcg total) by mouth daily before breakfast. 03/06/16  Yes Elby Beck, FNP  Multiple Vitamin (MULTI-VITAMIN DAILY PO) Take by mouth daily.   Yes Historical Provider, MD  sertraline (ZOLOFT) 25 MG tablet TAKE 1/2 TABLET BY MOUTH DAILY 05/07/16  Yes Elby Beck, FNP     Allergies  Allergen Reactions  . Tetracyclines & Related Nausea Only    Pt states she can take Doxycycline   . Septra [Bactrim] Other (See Comments)    flushing       Objective:  Physical Exam  Constitutional: She is oriented to person, place, and time. She appears well-developed and well-nourished.  HENT:  Head: Normocephalic and atraumatic.  Right Ear: External ear normal.  Left Ear: External ear normal.  Mouth/Throat: No oropharyngeal exudate.  Pharynx slightly erythematous  Eyes: Conjunctivae and EOM are normal. Pupils are equal, round, and reactive to light.  Neck: Normal range of motion.  Cardiovascular: Normal rate, regular rhythm, normal heart sounds and intact distal pulses.   Pulmonary/Chest: Effort normal and breath sounds  normal.  Negative adventitious sounds  Musculoskeletal: Normal range of motion.  Neurological: She is alert and oriented to person, place, and time.  Psychiatric:  Anxious    . Vitals:   07/17/16 1643  BP: 110/66  Pulse: 74  Resp: 18  Temp: 98.3 F (36.8 C)    Assessment & Plan:  1. Cough, likely related to post nasal drip Take benzonatate 100 mg , up to three times daily as needed for cough.  2. Post-nasal drip, likely environmental allergy and or viral  Resume using Flonase daily intranasally to relieve symptoms.  Follow-up if cough doesn't improve.  Carroll Sage. Kenton Kingfisher, MSN, FNP-C [facility]

## 2016-07-17 NOTE — Patient Instructions (Addendum)
  Follow-up if cough does not improve within 7-10 days.   IF you received an x-ray today, you will receive an invoice from [facility]. Please contact [facility] at [phone] with questions or concerns regarding your invoice.   IF you received labwork today, you will receive an invoice from [employer]. Please contact [facility] at [phone] with questions or concerns regarding your invoice.   Our billing staff will not be able to assist you with questions regarding bills from these companies.  You will be contacted with the lab results as soon as they are available. The fastest way to get your results is to activate your My Chart account. Instructions are located on the last page of this paperwork. If you have not heard from [place] regarding the results in 2 weeks, please contact this office.     Cough, Adult Coughing is a reflex that clears your throat and your airways. Coughing helps to heal and protect your lungs. It is normal to cough occasionally, but a cough that happens with other symptoms or lasts a long time may be a sign of a condition that needs treatment. A cough may last only 2-3 weeks (acute), or it may last longer than 8 weeks (chronic). CAUSES Coughing is commonly caused by:  Breathing in substances that irritate your lungs.  A viral or bacterial respiratory infection.  Allergies.  Asthma.  Postnasal drip.  Smoking.  Acid backing up from the stomach into the esophagus (gastroesophageal reflux).  Certain medicines.  Chronic lung problems, including COPD (or rarely, lung cancer).  Other medical conditions such as heart failure. HOME CARE INSTRUCTIONS  Pay attention to any changes in your symptoms. Take these actions to help with your discomfort:  Take medicines only as told by your health care provider.  If you were prescribed an antibiotic medicine, take it as told by your health care provider. Do not stop taking  the antibiotic even if you start to feel better.  Talk with your health care provider before you take a cough suppressant medicine.  Drink enough fluid to keep your urine clear or pale yellow.  If the air is dry, use a cold steam vaporizer or humidifier in your bedroom or your home to help loosen secretions.  Avoid anything that causes you to cough at work or at home.  If your cough is worse at night, try sleeping in a semi-upright position.  Avoid cigarette smoke. If you smoke, quit smoking. If you need help quitting, ask your health care provider.  Avoid caffeine.  Avoid alcohol.  Rest as needed. SEEK MEDICAL CARE IF:   You have new symptoms.  You cough up pus.  Your cough does not get better after 2-3 weeks, or your cough gets worse.  You cannot control your cough with suppressant medicines and you are losing sleep.  You develop pain that is getting worse or pain that is not controlled with pain medicines.  You have a fever.  You have unexplained weight loss.  You have night sweats. SEEK IMMEDIATE MEDICAL CARE IF:  You cough up blood.  You have difficulty breathing.  Your heartbeat is very fast.   This information is not intended to replace advice given to you by your health care provider. Make sure you discuss any questions you have with your health care provider.   Document Released: 05/23/2011 Document Revised: 08/15/2015 Document Reviewed: 01/31/2015 Elsevier Interactive Patient Education [employer].

## 2016-07-25 ENCOUNTER — Telehealth: Payer: Self-pay

## 2016-07-25 NOTE — Telephone Encounter (Signed)
Patient was recently seen and her cough is coming back. Patient wants to know if she can try using a z-pak. Patient states that she's been seen twice and does not want to come back in. She also states she's going out of town and would like a response today or tomorrow. Please advise! [phone]

## 2016-07-29 NOTE — Telephone Encounter (Signed)
This patient will have to come in and be seen prior to sending in an antibiotic.  We can't call in an antibiotic without evaluating the patient.  I would advise her to resume taking the benzonatate if she stopped in order to see if cough resolves. Otherwise she will need to come in for re-evaluation.  Thanks,  Carroll Sage. Kenton Kingfisher, MSN, FNP-C [facility]

## 2016-07-29 NOTE — Telephone Encounter (Signed)
I only see that the pt has been here once to see Maudie Mercury on 8/10. Kim, do you want to Rx an Abx, or does pt need to come back in first? If you want to Rx something locally, [facility] where she is should be able to transfer it.

## 2016-07-29 NOTE — Telephone Encounter (Signed)
LMOM for pt advising that she will need to RTC for re-eval if she is not better. Also advised to continue tessalon if still coughing and to have pharm send req for RF of that if needed.

## 2016-09-18 ENCOUNTER — Other Ambulatory Visit: Payer: Self-pay | Admitting: Family Medicine

## 2016-09-18 DIAGNOSIS — E039 Hypothyroidism, unspecified: Secondary | ICD-10-CM

## 2016-09-19 ENCOUNTER — Other Ambulatory Visit: Payer: Self-pay | Admitting: Family Medicine

## 2016-09-19 ENCOUNTER — Telehealth: Payer: Self-pay | Admitting: Family Medicine

## 2016-09-19 DIAGNOSIS — E039 Hypothyroidism, unspecified: Secondary | ICD-10-CM

## 2016-09-19 NOTE — Telephone Encounter (Signed)
PATIENT STATES HER PHARMACY HAS SENT OVER A REQUEST FOR LEVOTHYROXINE SODIUM 50 MG. SHE SAID SHE HAS BEEN TAKING THE SYNTHROID, BUT SHE WOULD LIKE TO GIVE THIS GENERIC A TRY. SHE WANTS TO GET IT FOR 90 DAYS. BEST PHONE [phone] (HOME)  [place].  [place]

## 2016-09-19 NOTE — Telephone Encounter (Signed)
Pt calling stating that she is out of he medicine and that she would like the generic LEVOTHYROXINE 50 mcg sent to the [facility] on [address]

## 2016-09-20 ENCOUNTER — Other Ambulatory Visit: Payer: Self-pay | Admitting: Family Medicine

## 2016-09-20 DIAGNOSIS — E039 Hypothyroidism, unspecified: Secondary | ICD-10-CM

## 2016-09-22 NOTE — Telephone Encounter (Signed)
Called pt and explained that Deliah Goody had been getting the RF reqs from the pharm and had sent Rx for new levothyroxine Rx. We then sent  In the sythroid she had been taking to pharm as well. Pt understood and will check with pharm to make sure correct Rx is picked up. Trans pt to scheduling for appt to est care with new provider.

## 2016-10-06 ENCOUNTER — Ambulatory Visit (INDEPENDENT_AMBULATORY_CARE_PROVIDER_SITE_OTHER): Payer: Medicare Other | Admitting: Family Medicine

## 2016-10-06 VITALS — BP 112/72 | HR 68 | Temp 97.9°F | Resp 18 | Ht 66.5 in | Wt 126.6 lb

## 2016-10-06 DIAGNOSIS — E039 Hypothyroidism, unspecified: Secondary | ICD-10-CM

## 2016-10-06 DIAGNOSIS — Z23 Encounter for immunization: Secondary | ICD-10-CM

## 2016-10-06 DIAGNOSIS — F419 Anxiety disorder, unspecified: Secondary | ICD-10-CM | POA: Diagnosis not present

## 2016-10-06 MED ORDER — LEVOTHYROXINE SODIUM 50 MCG PO TABS: ORAL_TABLET | ORAL | 3 refills | Status: AC

## 2016-10-06 MED ORDER — SERTRALINE HCL 25 MG PO TABS: 12.5000 mg | ORAL_TABLET | Freq: Every day | ORAL | 3 refills | Status: AC

## 2016-10-06 NOTE — Patient Instructions (Signed)
Nice meeting you!  Follow-up in April 2018 for your annual physical  Medications have been refilled.      IF you received an x-ray today, you will receive an invoice from [facility]. Please contact [facility] at [phone] with questions or concerns regarding your invoice.   IF you received labwork today, you will receive an invoice from [employer]. Please contact [facility] at [phone] with questions or concerns regarding your invoice.   Our billing staff will not be able to assist you with questions regarding bills from these companies.  You will be contacted with the lab results as soon as they are available. The fastest way to get your results is to activate your My Chart account. Instructions are located on the last page of this paperwork. If you have not heard from [place] regarding the results in 2 weeks, please contact this office.

## 2016-10-06 NOTE — Progress Notes (Signed)
Patient ID: Cathy Melendez, female    DOB: Jun 24, 1946, 70 y.o.   MRN: [id number]  PCP: Jenny Reichmann, MD  Chief Complaint  Patient presents with  . Follow-up    6 MONTH MED CHECK    Subjective:   HPI 70 year old female presents today to establish care with a new provider and obtain medication refills on levothyroxine and sertraline. She is an established patient here at [facility] and prior patient of Dr. Arlyss Queen. Patient reports that she was last seen for an physical annual exam 03/2016.  TSH at that time was within normal range. She denies any acute fatigue, weight-gain, and thinning of hair.  Reports overall her anxiety is very well controlled with the current dose of medication. Denies any anxiousness, panic attacks, or depressive symptoms.  Reports she has been taking medication for several years without any medication side effects.   Social History   Social History  . Marital status: Married    Spouse name: N/A  . Number of children: N/A  . Years of education: N/A   Occupational History  . Part time Psychologist    Social History Main Topics  . Smoking status: Former Smoker    Years: 3.00    Types: Cigarettes    Quit date: 01/02/1972  . Smokeless tobacco: Never Used     Comment: per pt a social smoker, also not sure the year stopped  . Alcohol use Yes     Comment: glass of wine daily  . Drug use: No  . Sexual activity: Yes   Other Topics Concern  . Not on file   Social History Narrative   Married. Education: [employer]. Exercise: yes.   Family History  Problem Relation Age of Onset  . Cancer Mother     breast cancer  . Cancer Father     lung cancer  . Heart disease Maternal Grandmother     Review of Systems  See HPI  Patient Active Problem List   Diagnosis Date Noted  . PVC (premature ventricular contraction) 02/15/2015  . Skin cancer 01/19/2013  . Chronic rhinitis 09/01/2012  . ETD (eustachian tube dysfunction) 08/04/2012  . Deviated nasal  septum 08/04/2012  . Postmenopausal 02/04/2012  . Hypothyroid 01/31/2012  . Anxiety 01/31/2012     Prior to Admission medications   Medication Sig Start Date End Date Taking? Authorizing Provider  CALCIUM PO Take by mouth daily.   Yes Historical Provider, MD  Multiple Vitamin (MULTI-VITAMIN DAILY PO) Take by mouth daily.   Yes Historical Provider, MD  sertraline (ZOLOFT) 25 MG tablet TAKE 1/2 TABLET BY MOUTH DAILY 05/07/16  Yes Elby Beck, FNP  SYNTHROID 50 MCG tablet TAKE 1 TABLET(50 MCG) BY MOUTH DAILY BEFORE BREAKFAST 09/21/16  Yes Chelle Jeffery, PA-C     Allergies  Allergen Reactions  . Tetracyclines & Related Nausea Only    Pt states she can take Doxycycline   . Septra [Bactrim] Other (See Comments)    flushing       Objective:  Physical Exam  Constitutional: She is oriented to person, place, and time. She appears well-developed and well-nourished.  HENT:  Head: Normocephalic and atraumatic.  Right Ear: External ear normal.  Left Ear: External ear normal.  Nose: Nose normal.  Mouth/Throat: Oropharynx is clear and moist.  Eyes: Conjunctivae and EOM are normal. Pupils are equal, round, and reactive to light.  Neck: Normal range of motion. Neck supple.  Cardiovascular: Normal rate, regular rhythm, normal heart sounds and  intact distal pulses.   Pulmonary/Chest: Effort normal and breath sounds normal.  Musculoskeletal: Normal range of motion.  Neurological: She is alert and oriented to person, place, and time.  Skin: Skin is warm and dry.  Psychiatric: She has a normal mood and affect. Her behavior is normal. Judgment and thought content normal.   Vitals:   10/06/16 1120  BP: 112/72  Pulse: 68  Resp: 18  Temp: 97.9 F (36.6 C)     Assessment & Plan:  1. Hypothyroidism, unspecified type Plan: - levothyroxine (SYNTHROID) 50 MCG tablet; TAKE 1 TABLET(50 MCG) BY MOUTH DAILY BEFORE BREAKFAST  Dispense: 90 tablet; Refill: 3  2. Anxiety, stable  -Sertraline  (Zoloft)   Plan: -Sertraline (Zoloft) 25 mg tablet , take 1/2 tablet 12.5 mg daily  3. Needs flu shot - Flu Vaccine QUAD 36+ mos IM   Return for follow-up in April 2018 annual physical or sooner if needed.  Carroll Sage. Kenton Kingfisher, MSN, FNP-C [facility]

## 2016-11-17 ENCOUNTER — Ambulatory Visit (INDEPENDENT_AMBULATORY_CARE_PROVIDER_SITE_OTHER): Payer: Medicare Other | Admitting: Family Medicine

## 2016-11-17 ENCOUNTER — Ambulatory Visit (INDEPENDENT_AMBULATORY_CARE_PROVIDER_SITE_OTHER): Payer: Medicare Other

## 2016-11-17 ENCOUNTER — Encounter: Payer: Self-pay | Admitting: Family Medicine

## 2016-11-17 VITALS — BP 126/80 | HR 77 | Temp 98.4°F | Resp 16 | Ht 66.0 in | Wt 128.2 lb

## 2016-11-17 DIAGNOSIS — M25512 Pain in left shoulder: Secondary | ICD-10-CM | POA: Diagnosis not present

## 2016-11-17 MED ORDER — PREDNISONE 20 MG PO TABS: ORAL_TABLET | ORAL | 0 refills | Status: DC

## 2016-11-17 MED ORDER — ACETAMINOPHEN-CODEINE #2 300-15 MG PO TABS: 1.0000 | ORAL_TABLET | Freq: Four times a day (QID) | ORAL | 0 refills | Status: AC | PRN

## 2016-11-17 MED ORDER — MELOXICAM 15 MG PO TABS: 15.0000 mg | ORAL_TABLET | Freq: Every day | ORAL | 1 refills | Status: AC

## 2016-11-17 MED ORDER — ACETAMINOPHEN-CODEINE #2 300-15 MG PO TABS: 1.0000 | ORAL_TABLET | Freq: Four times a day (QID) | ORAL | 0 refills | Status: DC | PRN

## 2016-11-17 NOTE — Progress Notes (Signed)
Patient ID: Cathy Melendez, female    DOB: May 15, 1946, 70 y.o.   MRN: [id number]  PCP: Jenny Reichmann, MD  Chief Complaint  Patient presents with  . left shoulder pain    x 2-4 wks    Subjective:   HPI 70 year old female presents for evaluation of left shoulder pain x 2-4 weeks. She sustained a fall in late October and feels injury may have occurred at that time. Sitting in one position seems to experience sensation poking in her left shoulder.  Complete range of motion, aggravated by sitting and stretching arms out and typing on computer She hasn't taken medication for pain.  Social History   Social History  . Marital status: Married    Spouse name: N/A  . Number of children: N/A  . Years of education: N/A   Occupational History  . Part time Psychologist    Social History Main Topics  . Smoking status: Former Smoker    Years: 3.00    Types: Cigarettes    Quit date: 01/02/1972  . Smokeless tobacco: Never Used     Comment: per pt a social smoker, also not sure the year stopped  . Alcohol use Yes     Comment: glass of wine daily  . Drug use: No  . Sexual activity: Yes   Other Topics Concern  . Not on file   Social History Narrative   Married. Education: [employer]. Exercise: yes.    Family History  Problem Relation Age of Onset  . Cancer Mother     breast cancer  . Cancer Father     lung cancer  . Heart disease Maternal Grandmother    Review of Systems See HPI Patient Active Problem List   Diagnosis Date Noted  . PVC (premature ventricular contraction) 02/15/2015  . Skin cancer 01/19/2013  . Chronic rhinitis 09/01/2012  . ETD (eustachian tube dysfunction) 08/04/2012  . Deviated nasal septum 08/04/2012  . Postmenopausal 02/04/2012  . Hypothyroid 01/31/2012  . Anxiety 01/31/2012     Prior to Admission medications   Medication Sig Start Date End Date Taking? Authorizing Provider  CALCIUM PO Take by mouth daily.   Yes Historical Provider, MD    levothyroxine (SYNTHROID) 50 MCG tablet TAKE 1 TABLET(50 MCG) BY MOUTH DAILY BEFORE BREAKFAST 10/06/16  Yes Sedalia Muta, FNP  Multiple Vitamin (MULTI-VITAMIN DAILY PO) Take by mouth daily.   Yes Historical Provider, MD  sertraline (ZOLOFT) 25 MG tablet Take 0.5 tablets (12.5 mg total) by mouth daily. 10/06/16  Yes Sedalia Muta, FNP    Allergies  Allergen Reactions  . Tetracyclines & Related Nausea Only    Pt states she can take Doxycycline   . Septra [Bactrim] Other (See Comments)    flushing     Objective:  Physical Exam  Constitutional: She is oriented to person, place, and time. She appears well-developed and well-nourished.  HENT:  Head: Normocephalic and atraumatic.  Right Ear: External ear normal.  Left Ear: External ear normal.  Neck: Normal range of motion. Neck supple.  Cervical ROM doesn't aggravate shoulder pain or spasm  Cardiovascular: Normal rate, regular rhythm, normal heart sounds and intact distal pulses.   Pulmonary/Chest: Effort normal and breath sounds normal.  Musculoskeletal: Normal range of motion.  Lymphadenopathy:    She has no cervical adenopathy.  Neurological: She is alert and oriented to person, place, and time.  Skin: Skin is warm and dry.  Psychiatric: She has a normal mood and affect. Her  behavior is normal. Judgment and thought content normal.   Vitals:   11/17/16 1206  BP: 126/80  Pulse: 77  Resp: 16  Temp: 98.4 F (36.9 C)   Assessment & Plan:  1. Acute pain of left shoulder - DG Shoulder Left  Plan: Meloxicam (Mobic) 15 mg tablets, daily, for 14 days. Tylenol w/Codeine 300/15mg  tablets 1 tablet, every 6 hours as needed for severe pain.  Return for care if pain worsens or doesn't resolve.  Carroll Sage. Kenton Kingfisher, MSN, FNP-C [facility]

## 2016-11-17 NOTE — Patient Instructions (Addendum)
Meloxicam 15 mg once daily with food x 14 days.  Take Tylenol with Codeine every 6 hours as needed for severe pain.  Return for care if pain worsens or doesn't resolve.   IF you received an x-ray today, you will receive an invoice from [facility]. Please contact [facility] at [phone] with questions or concerns regarding your invoice.   IF you received labwork today, you will receive an invoice from [facility] [employer]. Please contact [facility] at [phone] with questions or concerns regarding your invoice.   Our billing staff will not be able to assist you with questions regarding bills from these companies.  You will be contacted with the lab results as soon as they are available. The fastest way to get your results is to activate your My Chart account. Instructions are located on the last page of this paperwork. If you have not heard from [place] regarding the results in 2 weeks, please contact this office.    Burner or Stinger Nerve Injury Introduction A burner or stinger is an injury to the brachial plexus that occurs from a trauma to the neck or shoulder. The brachial plexus is a set of nerves that run from your neck to your shoulder, arm, hand, and fingers. These nerves allow movement and feeling in these areas of the body. When the nerves are stretched or compressed, this can cause pain, numbness, and weakness in the arm. Burners or stingers are common in contact sports such as football or wrestling. In many cases, the pain and weakness last for only a few minutes after the injury, and no further problems arise. However, the symptoms can also last for a few days or weeks depending on the severity of the injury. What are the causes? These injuries are usually caused by direct trauma to the head, neck, or shoulder. This type of trauma often occurs during a collision or fall in contact sports such as football. What increases the risk?  Participating  in contact sports.  Having poor strength and flexibility.  Having had a previous burner or stinger injury.  Wearing ill-fitting shoulder pads during contact sports.  Having a small spinal canal in the neck. What are the signs or symptoms? Symptoms often last for just a brief period after the injury, but they can also last for a few days or weeks. Usually, only one arm is affected. Common symptoms include:  Burning or stinging pain that starts in the shoulder and shoots into the arm.  Numbness, weakness, or brief paralysis of the shoulder, arm, or hand.  A tingling orpins andneedles feeling that runs down the arm.  Decrease in function of the shoulder, arm, or hand. How is this diagnosed? Your health care provider can usually diagnose this condition by doing a physical exam, evaluating your symptoms, and asking about the details of the injury. Tests are sometimes done to check the severity of the injury or to look for other problems. Tests may include:  Imaging tests such as X-rays or an MRI.  Electromyography or nerve conduction tests. These tests measure how well your nerves are working. How is this treated? Burners or stingers often do not require any treatment. The symptoms usually go away in a short time. When needed, treatment options may include:  Applying ice to the injured area.  Taking medicines for pain or inflammation.  Doing physical therapy to maintain strength and flexibility.  Using a splint or sling. Follow these instructions at home:  Rest as directed by  your health care provider. Do not participate in sports or perform the activity that caused the injury until your symptoms go away and your health care provider approves.  Apply ice to the injured area.  Put ice in a plastic bag.  Place a towel between your skin and the bag.  Leave the ice on for 20 minutes, 2-3 times per day.  Take medicines only as directed by your health care provider.  If you  were given a splint or sling, wear it as instructed. You may remove it to shower.  Perform strength and range-of-motion exercises as directed by your health care provider or physical therapist.  Keep all follow-up visits as directed by your health care provider. This is important. How is this prevented? Taking these steps can help prevent another burner or stinger injury:  Perform exercises to keep your neck and shoulder muscles strong and flexible.  Wear proper protective equipment for your neck and shoulder when playing sports.  Use proper technique when performing actions in contact sports.  Stretch your neck and shoulder muscles before participating in sports. Contact a health care provider if:  Your symptoms do not improve within 1 week.  Your symptoms get worse.  You have symptoms in both arms. Get help right away if:  You develop severe neck pain.  You lose control of your urine or stool.  You develop new or increased weakness in your arms or legs. This information is not intended to replace advice given to you by your health care provider. Make sure you discuss any questions you have with your health care provider. Document Released: 02/14/2004 Document Revised: 05/01/2016 Document Reviewed: 07/27/2014  2017 [employer]

## 2016-11-20 ENCOUNTER — Other Ambulatory Visit: Payer: Self-pay | Admitting: Orthopedic Surgery

## 2016-11-20 DIAGNOSIS — R0789 Other chest pain: Secondary | ICD-10-CM

## 2016-11-28 ENCOUNTER — Other Ambulatory Visit: Payer: Self-pay

## 2016-12-21 ENCOUNTER — Other Ambulatory Visit: Payer: Self-pay | Admitting: Physician Assistant

## 2016-12-21 DIAGNOSIS — E039 Hypothyroidism, unspecified: Secondary | ICD-10-CM

## 2017-03-02 ENCOUNTER — Encounter: Payer: Self-pay | Admitting: Physician Assistant

## 2018-01-04 ENCOUNTER — Other Ambulatory Visit: Payer: Self-pay | Admitting: Family Medicine

## 2018-01-04 ENCOUNTER — Ambulatory Visit
Admission: RE | Admit: 2018-01-04 | Discharge: 2018-01-04 | Disposition: A | Discharge status: Home / Self Care | Payer: Medicare Other | Source: Ambulatory Visit | Attending: Family Medicine | Admitting: Family Medicine

## 2018-01-04 DIAGNOSIS — R05 Cough: Secondary | ICD-10-CM

## 2018-01-04 DIAGNOSIS — R059 Cough, unspecified: Secondary | ICD-10-CM

## 2018-01-04 DIAGNOSIS — R0989 Other specified symptoms and signs involving the circulatory and respiratory systems: Secondary | ICD-10-CM

## 2018-01-17 ENCOUNTER — Other Ambulatory Visit: Payer: Self-pay | Admitting: Family Medicine

## 2018-01-17 DIAGNOSIS — R5381 Other malaise: Secondary | ICD-10-CM

## 2018-01-22 ENCOUNTER — Other Ambulatory Visit: Payer: Self-pay | Admitting: Family Medicine

## 2018-01-22 DIAGNOSIS — R05 Cough: Secondary | ICD-10-CM

## 2018-01-22 DIAGNOSIS — R053 Chronic cough: Secondary | ICD-10-CM

## 2018-02-08 ENCOUNTER — Ambulatory Visit
Admission: RE | Admit: 2018-02-08 | Discharge: 2018-02-08 | Disposition: A | Discharge status: Home / Self Care | Payer: Medicare Other | Source: Ambulatory Visit | Attending: Family Medicine | Admitting: Family Medicine

## 2018-02-08 DIAGNOSIS — R05 Cough: Secondary | ICD-10-CM

## 2018-02-08 DIAGNOSIS — R053 Chronic cough: Secondary | ICD-10-CM

## 2018-02-08 MED ORDER — IOPAMIDOL (ISOVUE-300) INJECTION 61%
75.0000 mL | Freq: Once | INTRAVENOUS | Status: AC | PRN
  Administered 2018-02-08: 75 mL via INTRAVENOUS

## 2021-09-02 DIAGNOSIS — L814 Other melanin hyperpigmentation: Secondary | ICD-10-CM | POA: Diagnosis not present

## 2021-09-02 DIAGNOSIS — L570 Actinic keratosis: Secondary | ICD-10-CM | POA: Diagnosis not present

## 2021-09-02 DIAGNOSIS — Z85828 Personal history of other malignant neoplasm of skin: Secondary | ICD-10-CM | POA: Diagnosis not present

## 2021-09-02 DIAGNOSIS — D225 Melanocytic nevi of trunk: Secondary | ICD-10-CM | POA: Diagnosis not present

## 2021-09-02 DIAGNOSIS — L821 Other seborrheic keratosis: Secondary | ICD-10-CM | POA: Diagnosis not present

## 2021-09-02 DIAGNOSIS — L718 Other rosacea: Secondary | ICD-10-CM | POA: Diagnosis not present

## 2021-09-02 DIAGNOSIS — Z08 Encounter for follow-up examination after completed treatment for malignant neoplasm: Secondary | ICD-10-CM | POA: Diagnosis not present

## 2021-09-02 DIAGNOSIS — R202 Paresthesia of skin: Secondary | ICD-10-CM | POA: Diagnosis not present

## 2021-09-02 DIAGNOSIS — Z85820 Personal history of malignant melanoma of skin: Secondary | ICD-10-CM | POA: Diagnosis not present

## 2022-02-14 DIAGNOSIS — M25562 Pain in left knee: Secondary | ICD-10-CM | POA: Diagnosis not present

## 2022-02-14 DIAGNOSIS — M722 Plantar fascial fibromatosis: Secondary | ICD-10-CM | POA: Diagnosis not present

## 2022-02-27 DIAGNOSIS — M25562 Pain in left knee: Secondary | ICD-10-CM | POA: Diagnosis not present

## 2022-03-03 DIAGNOSIS — L814 Other melanin hyperpigmentation: Secondary | ICD-10-CM | POA: Diagnosis not present

## 2022-03-03 DIAGNOSIS — D225 Melanocytic nevi of trunk: Secondary | ICD-10-CM | POA: Diagnosis not present

## 2022-03-03 DIAGNOSIS — L821 Other seborrheic keratosis: Secondary | ICD-10-CM | POA: Diagnosis not present

## 2022-05-19 DIAGNOSIS — E039 Hypothyroidism, unspecified: Secondary | ICD-10-CM | POA: Diagnosis not present

## 2022-05-19 DIAGNOSIS — R7989 Other specified abnormal findings of blood chemistry: Secondary | ICD-10-CM | POA: Diagnosis not present

## 2022-05-19 DIAGNOSIS — E559 Vitamin D deficiency, unspecified: Secondary | ICD-10-CM | POA: Diagnosis not present

## 2022-05-19 DIAGNOSIS — E785 Hyperlipidemia, unspecified: Secondary | ICD-10-CM | POA: Diagnosis not present

## 2022-05-25 DIAGNOSIS — Z0000 Encounter for general adult medical examination without abnormal findings: Secondary | ICD-10-CM | POA: Diagnosis not present

## 2022-05-26 ENCOUNTER — Other Ambulatory Visit: Payer: Self-pay | Admitting: Endocrinology

## 2022-05-26 DIAGNOSIS — R82998 Other abnormal findings in urine: Secondary | ICD-10-CM | POA: Diagnosis not present

## 2022-05-26 DIAGNOSIS — E042 Nontoxic multinodular goiter: Secondary | ICD-10-CM | POA: Diagnosis not present

## 2022-05-26 DIAGNOSIS — D126 Benign neoplasm of colon, unspecified: Secondary | ICD-10-CM | POA: Diagnosis not present

## 2022-05-26 DIAGNOSIS — D7589 Other specified diseases of blood and blood-forming organs: Secondary | ICD-10-CM | POA: Diagnosis not present

## 2022-05-26 DIAGNOSIS — I493 Ventricular premature depolarization: Secondary | ICD-10-CM

## 2022-05-26 DIAGNOSIS — E785 Hyperlipidemia, unspecified: Secondary | ICD-10-CM | POA: Diagnosis not present

## 2022-05-26 DIAGNOSIS — F330 Major depressive disorder, recurrent, mild: Secondary | ICD-10-CM | POA: Diagnosis not present

## 2022-05-26 DIAGNOSIS — C439 Malignant melanoma of skin, unspecified: Secondary | ICD-10-CM | POA: Diagnosis not present

## 2022-05-26 DIAGNOSIS — E039 Hypothyroidism, unspecified: Secondary | ICD-10-CM | POA: Diagnosis not present

## 2022-05-26 DIAGNOSIS — Z0000 Encounter for general adult medical examination without abnormal findings: Secondary | ICD-10-CM | POA: Diagnosis not present

## 2022-05-26 DIAGNOSIS — Z803 Family history of malignant neoplasm of breast: Secondary | ICD-10-CM | POA: Diagnosis not present

## 2022-07-14 ENCOUNTER — Ambulatory Visit
Admission: RE | Admit: 2022-07-14 | Discharge: 2022-07-14 | Disposition: A | Discharge status: Home / Self Care | Payer: Medicare PPO | Source: Ambulatory Visit | Attending: Endocrinology | Admitting: Endocrinology

## 2022-07-14 DIAGNOSIS — I493 Ventricular premature depolarization: Secondary | ICD-10-CM

## 2022-08-07 DIAGNOSIS — Z85828 Personal history of other malignant neoplasm of skin: Secondary | ICD-10-CM | POA: Diagnosis not present

## 2022-08-07 DIAGNOSIS — Z08 Encounter for follow-up examination after completed treatment for malignant neoplasm: Secondary | ICD-10-CM | POA: Diagnosis not present

## 2022-08-07 DIAGNOSIS — D225 Melanocytic nevi of trunk: Secondary | ICD-10-CM | POA: Diagnosis not present

## 2022-08-07 DIAGNOSIS — L814 Other melanin hyperpigmentation: Secondary | ICD-10-CM | POA: Diagnosis not present

## 2022-08-07 DIAGNOSIS — L821 Other seborrheic keratosis: Secondary | ICD-10-CM | POA: Diagnosis not present

## 2022-08-07 DIAGNOSIS — Z85820 Personal history of malignant melanoma of skin: Secondary | ICD-10-CM | POA: Diagnosis not present

## 2022-08-07 DIAGNOSIS — L570 Actinic keratosis: Secondary | ICD-10-CM | POA: Diagnosis not present

## 2022-08-13 DIAGNOSIS — Z1231 Encounter for screening mammogram for malignant neoplasm of breast: Secondary | ICD-10-CM | POA: Diagnosis not present

## 2022-09-24 DIAGNOSIS — R110 Nausea: Secondary | ICD-10-CM | POA: Diagnosis not present

## 2022-09-24 DIAGNOSIS — K5900 Constipation, unspecified: Secondary | ICD-10-CM | POA: Diagnosis not present

## 2022-09-24 DIAGNOSIS — R5383 Other fatigue: Secondary | ICD-10-CM | POA: Diagnosis not present

## 2022-09-24 DIAGNOSIS — R1011 Right upper quadrant pain: Secondary | ICD-10-CM | POA: Diagnosis not present

## 2022-09-24 DIAGNOSIS — H43813 Vitreous degeneration, bilateral: Secondary | ICD-10-CM | POA: Diagnosis not present

## 2022-09-24 DIAGNOSIS — H31091 Other chorioretinal scars, right eye: Secondary | ICD-10-CM | POA: Diagnosis not present

## 2022-09-24 DIAGNOSIS — N3946 Mixed incontinence: Secondary | ICD-10-CM | POA: Diagnosis not present

## 2022-09-24 DIAGNOSIS — E039 Hypothyroidism, unspecified: Secondary | ICD-10-CM | POA: Diagnosis not present

## 2022-09-24 DIAGNOSIS — H2513 Age-related nuclear cataract, bilateral: Secondary | ICD-10-CM | POA: Diagnosis not present

## 2022-09-24 DIAGNOSIS — F329 Major depressive disorder, single episode, unspecified: Secondary | ICD-10-CM | POA: Diagnosis not present

## 2022-10-28 ENCOUNTER — Other Ambulatory Visit: Payer: Self-pay | Admitting: Registered Nurse

## 2022-10-28 DIAGNOSIS — R110 Nausea: Secondary | ICD-10-CM

## 2022-10-28 DIAGNOSIS — R1011 Right upper quadrant pain: Secondary | ICD-10-CM

## 2022-11-10 ENCOUNTER — Ambulatory Visit
Admission: RE | Admit: 2022-11-10 | Discharge: 2022-11-10 | Disposition: A | Discharge status: Home / Self Care | Payer: Medicare PPO | Source: Ambulatory Visit | Attending: Registered Nurse | Admitting: Registered Nurse

## 2022-11-10 DIAGNOSIS — R110 Nausea: Secondary | ICD-10-CM

## 2022-11-10 DIAGNOSIS — R1011 Right upper quadrant pain: Secondary | ICD-10-CM

## 2022-11-26 DIAGNOSIS — J069 Acute upper respiratory infection, unspecified: Secondary | ICD-10-CM | POA: Diagnosis not present

## 2022-11-26 DIAGNOSIS — R509 Fever, unspecified: Secondary | ICD-10-CM | POA: Diagnosis not present

## 2022-11-26 DIAGNOSIS — J029 Acute pharyngitis, unspecified: Secondary | ICD-10-CM | POA: Diagnosis not present

## 2022-11-26 DIAGNOSIS — R051 Acute cough: Secondary | ICD-10-CM | POA: Diagnosis not present

## 2022-11-26 DIAGNOSIS — R0981 Nasal congestion: Secondary | ICD-10-CM | POA: Diagnosis not present

## 2022-12-10 DIAGNOSIS — N3941 Urge incontinence: Secondary | ICD-10-CM | POA: Diagnosis not present

## 2022-12-10 DIAGNOSIS — R350 Frequency of micturition: Secondary | ICD-10-CM | POA: Diagnosis not present

## 2022-12-10 DIAGNOSIS — R351 Nocturia: Secondary | ICD-10-CM | POA: Diagnosis not present

## 2023-01-07 DIAGNOSIS — C44319 Basal cell carcinoma of skin of other parts of face: Secondary | ICD-10-CM | POA: Diagnosis not present

## 2023-01-07 DIAGNOSIS — D485 Neoplasm of uncertain behavior of skin: Secondary | ICD-10-CM | POA: Diagnosis not present

## 2023-01-15 DIAGNOSIS — N3941 Urge incontinence: Secondary | ICD-10-CM | POA: Diagnosis not present

## 2023-01-15 DIAGNOSIS — R278 Other lack of coordination: Secondary | ICD-10-CM | POA: Diagnosis not present

## 2023-01-15 DIAGNOSIS — R351 Nocturia: Secondary | ICD-10-CM | POA: Diagnosis not present

## 2023-01-19 DIAGNOSIS — C44319 Basal cell carcinoma of skin of other parts of face: Secondary | ICD-10-CM | POA: Diagnosis not present

## 2023-02-05 DIAGNOSIS — L814 Other melanin hyperpigmentation: Secondary | ICD-10-CM | POA: Diagnosis not present

## 2023-02-05 DIAGNOSIS — L720 Epidermal cyst: Secondary | ICD-10-CM | POA: Diagnosis not present

## 2023-02-05 DIAGNOSIS — L821 Other seborrheic keratosis: Secondary | ICD-10-CM | POA: Diagnosis not present

## 2023-02-05 DIAGNOSIS — D1801 Hemangioma of skin and subcutaneous tissue: Secondary | ICD-10-CM | POA: Diagnosis not present

## 2023-02-05 DIAGNOSIS — Z789 Other specified health status: Secondary | ICD-10-CM | POA: Diagnosis not present

## 2023-02-05 DIAGNOSIS — Z85828 Personal history of other malignant neoplasm of skin: Secondary | ICD-10-CM | POA: Diagnosis not present

## 2023-02-05 DIAGNOSIS — Z08 Encounter for follow-up examination after completed treatment for malignant neoplasm: Secondary | ICD-10-CM | POA: Diagnosis not present

## 2023-02-05 DIAGNOSIS — L820 Inflamed seborrheic keratosis: Secondary | ICD-10-CM | POA: Diagnosis not present

## 2023-02-05 DIAGNOSIS — L298 Other pruritus: Secondary | ICD-10-CM | POA: Diagnosis not present

## 2023-02-12 DIAGNOSIS — N3941 Urge incontinence: Secondary | ICD-10-CM | POA: Diagnosis not present

## 2023-02-12 DIAGNOSIS — R350 Frequency of micturition: Secondary | ICD-10-CM | POA: Diagnosis not present

## 2023-03-10 DIAGNOSIS — R351 Nocturia: Secondary | ICD-10-CM | POA: Diagnosis not present

## 2023-03-10 DIAGNOSIS — N3941 Urge incontinence: Secondary | ICD-10-CM | POA: Diagnosis not present

## 2023-03-10 DIAGNOSIS — R278 Other lack of coordination: Secondary | ICD-10-CM | POA: Diagnosis not present

## 2023-03-24 DIAGNOSIS — H2513 Age-related nuclear cataract, bilateral: Secondary | ICD-10-CM | POA: Diagnosis not present

## 2023-03-24 DIAGNOSIS — H31091 Other chorioretinal scars, right eye: Secondary | ICD-10-CM | POA: Diagnosis not present

## 2023-03-24 DIAGNOSIS — H43813 Vitreous degeneration, bilateral: Secondary | ICD-10-CM | POA: Diagnosis not present

## 2023-05-19 DIAGNOSIS — K648 Other hemorrhoids: Secondary | ICD-10-CM | POA: Diagnosis not present

## 2023-05-19 DIAGNOSIS — K645 Perianal venous thrombosis: Secondary | ICD-10-CM | POA: Diagnosis not present

## 2023-05-20 DIAGNOSIS — S52124A Nondisplaced fracture of head of right radius, initial encounter for closed fracture: Secondary | ICD-10-CM | POA: Diagnosis not present

## 2023-05-20 DIAGNOSIS — X58XXXA Exposure to other specified factors, initial encounter: Secondary | ICD-10-CM | POA: Diagnosis not present

## 2023-05-20 DIAGNOSIS — S60211A Contusion of right wrist, initial encounter: Secondary | ICD-10-CM | POA: Diagnosis not present

## 2023-05-20 DIAGNOSIS — M79601 Pain in right arm: Secondary | ICD-10-CM | POA: Diagnosis not present

## 2023-05-25 DIAGNOSIS — M25531 Pain in right wrist: Secondary | ICD-10-CM | POA: Diagnosis not present

## 2023-05-25 DIAGNOSIS — M25521 Pain in right elbow: Secondary | ICD-10-CM | POA: Diagnosis not present

## 2023-05-25 DIAGNOSIS — S52124A Nondisplaced fracture of head of right radius, initial encounter for closed fracture: Secondary | ICD-10-CM | POA: Diagnosis not present

## 2023-05-29 DIAGNOSIS — M25521 Pain in right elbow: Secondary | ICD-10-CM | POA: Diagnosis not present

## 2023-05-29 DIAGNOSIS — S52124A Nondisplaced fracture of head of right radius, initial encounter for closed fracture: Secondary | ICD-10-CM | POA: Diagnosis not present

## 2023-05-29 DIAGNOSIS — M25531 Pain in right wrist: Secondary | ICD-10-CM | POA: Diagnosis not present

## 2023-06-04 DIAGNOSIS — E785 Hyperlipidemia, unspecified: Secondary | ICD-10-CM | POA: Diagnosis not present

## 2023-06-04 DIAGNOSIS — R7989 Other specified abnormal findings of blood chemistry: Secondary | ICD-10-CM | POA: Diagnosis not present

## 2023-06-04 DIAGNOSIS — E559 Vitamin D deficiency, unspecified: Secondary | ICD-10-CM | POA: Diagnosis not present

## 2023-06-04 DIAGNOSIS — E039 Hypothyroidism, unspecified: Secondary | ICD-10-CM | POA: Diagnosis not present

## 2023-06-09 DIAGNOSIS — Z1339 Encounter for screening examination for other mental health and behavioral disorders: Secondary | ICD-10-CM | POA: Diagnosis not present

## 2023-06-09 DIAGNOSIS — Z0000 Encounter for general adult medical examination without abnormal findings: Secondary | ICD-10-CM | POA: Diagnosis not present

## 2023-06-09 DIAGNOSIS — E039 Hypothyroidism, unspecified: Secondary | ICD-10-CM | POA: Diagnosis not present

## 2023-06-09 DIAGNOSIS — C439 Malignant melanoma of skin, unspecified: Secondary | ICD-10-CM | POA: Diagnosis not present

## 2023-06-09 DIAGNOSIS — Z1212 Encounter for screening for malignant neoplasm of rectum: Secondary | ICD-10-CM | POA: Diagnosis not present

## 2023-06-09 DIAGNOSIS — D7589 Other specified diseases of blood and blood-forming organs: Secondary | ICD-10-CM | POA: Diagnosis not present

## 2023-06-09 DIAGNOSIS — R82998 Other abnormal findings in urine: Secondary | ICD-10-CM | POA: Diagnosis not present

## 2023-06-09 DIAGNOSIS — E785 Hyperlipidemia, unspecified: Secondary | ICD-10-CM | POA: Diagnosis not present

## 2023-06-09 DIAGNOSIS — Z1331 Encounter for screening for depression: Secondary | ICD-10-CM | POA: Diagnosis not present

## 2023-06-09 DIAGNOSIS — F330 Major depressive disorder, recurrent, mild: Secondary | ICD-10-CM | POA: Diagnosis not present

## 2023-06-19 DIAGNOSIS — S52124A Nondisplaced fracture of head of right radius, initial encounter for closed fracture: Secondary | ICD-10-CM | POA: Diagnosis not present

## 2023-06-19 DIAGNOSIS — M25531 Pain in right wrist: Secondary | ICD-10-CM | POA: Diagnosis not present

## 2023-06-21 DIAGNOSIS — M25531 Pain in right wrist: Secondary | ICD-10-CM | POA: Diagnosis not present

## 2023-07-07 DIAGNOSIS — M25521 Pain in right elbow: Secondary | ICD-10-CM | POA: Diagnosis not present

## 2023-07-07 DIAGNOSIS — S52124A Nondisplaced fracture of head of right radius, initial encounter for closed fracture: Secondary | ICD-10-CM | POA: Diagnosis not present

## 2023-08-05 DIAGNOSIS — K5790 Diverticulosis of intestine, part unspecified, without perforation or abscess without bleeding: Secondary | ICD-10-CM | POA: Diagnosis not present

## 2023-08-05 DIAGNOSIS — Z86010 Personal history of colonic polyps: Secondary | ICD-10-CM | POA: Diagnosis not present

## 2023-08-05 DIAGNOSIS — K648 Other hemorrhoids: Secondary | ICD-10-CM | POA: Diagnosis not present

## 2023-08-05 DIAGNOSIS — K5909 Other constipation: Secondary | ICD-10-CM | POA: Diagnosis not present

## 2023-08-06 DIAGNOSIS — Z85820 Personal history of malignant melanoma of skin: Secondary | ICD-10-CM | POA: Diagnosis not present

## 2023-08-06 DIAGNOSIS — L905 Scar conditions and fibrosis of skin: Secondary | ICD-10-CM | POA: Diagnosis not present

## 2023-08-06 DIAGNOSIS — L821 Other seborrheic keratosis: Secondary | ICD-10-CM | POA: Diagnosis not present

## 2023-08-06 DIAGNOSIS — Z789 Other specified health status: Secondary | ICD-10-CM | POA: Diagnosis not present

## 2023-08-06 DIAGNOSIS — L814 Other melanin hyperpigmentation: Secondary | ICD-10-CM | POA: Diagnosis not present

## 2023-08-06 DIAGNOSIS — L820 Inflamed seborrheic keratosis: Secondary | ICD-10-CM | POA: Diagnosis not present

## 2023-08-06 DIAGNOSIS — D225 Melanocytic nevi of trunk: Secondary | ICD-10-CM | POA: Diagnosis not present

## 2023-08-06 DIAGNOSIS — Z85828 Personal history of other malignant neoplasm of skin: Secondary | ICD-10-CM | POA: Diagnosis not present

## 2023-08-06 DIAGNOSIS — Z08 Encounter for follow-up examination after completed treatment for malignant neoplasm: Secondary | ICD-10-CM | POA: Diagnosis not present

## 2023-08-07 DIAGNOSIS — S52124A Nondisplaced fracture of head of right radius, initial encounter for closed fracture: Secondary | ICD-10-CM | POA: Diagnosis not present

## 2023-09-03 DIAGNOSIS — E349 Endocrine disorder, unspecified: Secondary | ICD-10-CM | POA: Diagnosis not present

## 2023-09-03 DIAGNOSIS — M8588 Other specified disorders of bone density and structure, other site: Secondary | ICD-10-CM | POA: Diagnosis not present

## 2023-09-03 DIAGNOSIS — Z1231 Encounter for screening mammogram for malignant neoplasm of breast: Secondary | ICD-10-CM | POA: Diagnosis not present

## 2023-09-03 DIAGNOSIS — E559 Vitamin D deficiency, unspecified: Secondary | ICD-10-CM | POA: Diagnosis not present

## 2023-09-15 DIAGNOSIS — S52124A Nondisplaced fracture of head of right radius, initial encounter for closed fracture: Secondary | ICD-10-CM | POA: Diagnosis not present

## 2023-09-21 DIAGNOSIS — H2513 Age-related nuclear cataract, bilateral: Secondary | ICD-10-CM | POA: Diagnosis not present

## 2023-09-21 DIAGNOSIS — H43813 Vitreous degeneration, bilateral: Secondary | ICD-10-CM | POA: Diagnosis not present

## 2023-09-21 DIAGNOSIS — H31091 Other chorioretinal scars, right eye: Secondary | ICD-10-CM | POA: Diagnosis not present

## 2023-09-28 DIAGNOSIS — H2511 Age-related nuclear cataract, right eye: Secondary | ICD-10-CM | POA: Diagnosis not present

## 2023-10-01 DIAGNOSIS — H2511 Age-related nuclear cataract, right eye: Secondary | ICD-10-CM | POA: Diagnosis not present

## 2023-11-19 DIAGNOSIS — H2512 Age-related nuclear cataract, left eye: Secondary | ICD-10-CM | POA: Diagnosis not present

## 2023-12-14 DIAGNOSIS — Z1211 Encounter for screening for malignant neoplasm of colon: Secondary | ICD-10-CM | POA: Diagnosis not present

## 2023-12-14 DIAGNOSIS — K648 Other hemorrhoids: Secondary | ICD-10-CM | POA: Diagnosis not present

## 2023-12-14 DIAGNOSIS — Q432 Other congenital functional disorders of colon: Secondary | ICD-10-CM | POA: Diagnosis not present

## 2023-12-14 DIAGNOSIS — Z860101 Personal history of adenomatous and serrated colon polyps: Secondary | ICD-10-CM | POA: Diagnosis not present

## 2023-12-14 DIAGNOSIS — K5730 Diverticulosis of large intestine without perforation or abscess without bleeding: Secondary | ICD-10-CM | POA: Diagnosis not present

## 2024-05-24 DIAGNOSIS — Z85820 Personal history of malignant melanoma of skin: Secondary | ICD-10-CM | POA: Diagnosis not present

## 2024-05-24 DIAGNOSIS — L821 Other seborrheic keratosis: Secondary | ICD-10-CM | POA: Diagnosis not present

## 2024-05-24 DIAGNOSIS — D1801 Hemangioma of skin and subcutaneous tissue: Secondary | ICD-10-CM | POA: Diagnosis not present

## 2024-05-24 DIAGNOSIS — L718 Other rosacea: Secondary | ICD-10-CM | POA: Diagnosis not present

## 2024-05-24 DIAGNOSIS — Z08 Encounter for follow-up examination after completed treatment for malignant neoplasm: Secondary | ICD-10-CM | POA: Diagnosis not present

## 2024-05-24 DIAGNOSIS — L814 Other melanin hyperpigmentation: Secondary | ICD-10-CM | POA: Diagnosis not present

## 2024-05-24 DIAGNOSIS — Z85828 Personal history of other malignant neoplasm of skin: Secondary | ICD-10-CM | POA: Diagnosis not present

## 2024-06-06 DIAGNOSIS — Z1212 Encounter for screening for malignant neoplasm of rectum: Secondary | ICD-10-CM | POA: Diagnosis not present

## 2024-06-06 DIAGNOSIS — E039 Hypothyroidism, unspecified: Secondary | ICD-10-CM | POA: Diagnosis not present

## 2024-06-07 DIAGNOSIS — E039 Hypothyroidism, unspecified: Secondary | ICD-10-CM | POA: Diagnosis not present

## 2024-06-07 DIAGNOSIS — E559 Vitamin D deficiency, unspecified: Secondary | ICD-10-CM | POA: Diagnosis not present

## 2024-06-07 DIAGNOSIS — E785 Hyperlipidemia, unspecified: Secondary | ICD-10-CM | POA: Diagnosis not present

## 2024-06-13 DIAGNOSIS — Z1331 Encounter for screening for depression: Secondary | ICD-10-CM | POA: Diagnosis not present

## 2024-06-13 DIAGNOSIS — E559 Vitamin D deficiency, unspecified: Secondary | ICD-10-CM | POA: Diagnosis not present

## 2024-06-13 DIAGNOSIS — H04123 Dry eye syndrome of bilateral lacrimal glands: Secondary | ICD-10-CM | POA: Diagnosis not present

## 2024-06-13 DIAGNOSIS — D126 Benign neoplasm of colon, unspecified: Secondary | ICD-10-CM | POA: Diagnosis not present

## 2024-06-13 DIAGNOSIS — H43813 Vitreous degeneration, bilateral: Secondary | ICD-10-CM | POA: Diagnosis not present

## 2024-06-13 DIAGNOSIS — I493 Ventricular premature depolarization: Secondary | ICD-10-CM | POA: Diagnosis not present

## 2024-06-13 DIAGNOSIS — E042 Nontoxic multinodular goiter: Secondary | ICD-10-CM | POA: Diagnosis not present

## 2024-06-13 DIAGNOSIS — E039 Hypothyroidism, unspecified: Secondary | ICD-10-CM | POA: Diagnosis not present

## 2024-06-13 DIAGNOSIS — F330 Major depressive disorder, recurrent, mild: Secondary | ICD-10-CM | POA: Diagnosis not present

## 2024-06-13 DIAGNOSIS — H31091 Other chorioretinal scars, right eye: Secondary | ICD-10-CM | POA: Diagnosis not present

## 2024-06-13 DIAGNOSIS — Z0000 Encounter for general adult medical examination without abnormal findings: Secondary | ICD-10-CM | POA: Diagnosis not present

## 2024-06-13 DIAGNOSIS — Z803 Family history of malignant neoplasm of breast: Secondary | ICD-10-CM | POA: Diagnosis not present

## 2024-06-13 DIAGNOSIS — Z1339 Encounter for screening examination for other mental health and behavioral disorders: Secondary | ICD-10-CM | POA: Diagnosis not present

## 2024-06-13 DIAGNOSIS — C439 Malignant melanoma of skin, unspecified: Secondary | ICD-10-CM | POA: Diagnosis not present

## 2024-06-13 DIAGNOSIS — E785 Hyperlipidemia, unspecified: Secondary | ICD-10-CM | POA: Diagnosis not present

## 2024-09-08 DIAGNOSIS — Z1231 Encounter for screening mammogram for malignant neoplasm of breast: Secondary | ICD-10-CM | POA: Diagnosis not present

## 2024-11-02 DIAGNOSIS — K648 Other hemorrhoids: Secondary | ICD-10-CM | POA: Diagnosis not present
